# Patient Record
Sex: Female | Born: 1965 | Race: White | Hispanic: No | Marital: Married | Smoking: Current every day smoker
Health system: Southern US, Community
[De-identification: ages and names within clinical notes are randomized; demographics above are authoritative.]

## PROBLEM LIST (undated history)

## (undated) DIAGNOSIS — E785 Hyperlipidemia, unspecified: Secondary | ICD-10-CM

## (undated) DIAGNOSIS — Z720 Tobacco use: Secondary | ICD-10-CM

## (undated) DIAGNOSIS — N6019 Diffuse cystic mastopathy of unspecified breast: Secondary | ICD-10-CM

## (undated) DIAGNOSIS — L309 Dermatitis, unspecified: Secondary | ICD-10-CM

## (undated) DIAGNOSIS — R569 Unspecified convulsions: Secondary | ICD-10-CM

## (undated) DIAGNOSIS — T7840XA Allergy, unspecified, initial encounter: Secondary | ICD-10-CM

## (undated) DIAGNOSIS — F419 Anxiety disorder, unspecified: Secondary | ICD-10-CM

## (undated) DIAGNOSIS — J189 Pneumonia, unspecified organism: Secondary | ICD-10-CM

## (undated) HISTORY — DX: Tobacco use: Z72.0

## (undated) HISTORY — PX: TONSILLECTOMY: SUR1361

## (undated) HISTORY — DX: Dermatitis, unspecified: L30.9

## (undated) HISTORY — PX: TUBAL LIGATION: SHX77

## (undated) HISTORY — PX: OTHER SURGICAL HISTORY: SHX169

## (undated) HISTORY — DX: Anxiety disorder, unspecified: F41.9

## (undated) HISTORY — DX: Hyperlipidemia, unspecified: E78.5

## (undated) HISTORY — PX: BREAST CYST EXCISION: SHX579

## (undated) HISTORY — DX: Pneumonia, unspecified organism: J18.9

## (undated) HISTORY — DX: Diffuse cystic mastopathy of unspecified breast: N60.19

## (undated) HISTORY — DX: Unspecified convulsions: R56.9

## (undated) HISTORY — DX: Allergy, unspecified, initial encounter: T78.40XA

---

## 2000-09-05 ENCOUNTER — Other Ambulatory Visit: Admission: RE | Admit: 2000-09-05 | Discharge: 2000-09-05 | Payer: Self-pay | Admitting: Family Medicine

## 2000-09-12 ENCOUNTER — Encounter: Payer: Self-pay | Admitting: Family Medicine

## 2000-09-12 ENCOUNTER — Encounter: Admission: RE | Admit: 2000-09-12 | Discharge: 2000-09-12 | Payer: Self-pay | Admitting: Family Medicine

## 2001-01-20 ENCOUNTER — Other Ambulatory Visit: Admission: RE | Admit: 2001-01-20 | Discharge: 2001-01-20 | Payer: Self-pay | Admitting: Family Medicine

## 2002-10-04 ENCOUNTER — Other Ambulatory Visit: Admission: RE | Admit: 2002-10-04 | Discharge: 2002-10-04 | Payer: Self-pay | Admitting: Family Medicine

## 2004-04-17 ENCOUNTER — Other Ambulatory Visit: Admission: RE | Admit: 2004-04-17 | Discharge: 2004-04-17 | Payer: Self-pay | Admitting: Family Medicine

## 2005-08-01 ENCOUNTER — Ambulatory Visit: Payer: Self-pay | Admitting: Family Medicine

## 2005-08-01 ENCOUNTER — Other Ambulatory Visit: Admission: RE | Admit: 2005-08-01 | Discharge: 2005-08-01 | Payer: Self-pay | Admitting: Family Medicine

## 2005-08-22 ENCOUNTER — Encounter: Admission: RE | Admit: 2005-08-22 | Discharge: 2005-08-22 | Payer: Self-pay | Admitting: Family Medicine

## 2005-09-17 ENCOUNTER — Ambulatory Visit: Payer: Self-pay | Admitting: Family Medicine

## 2006-05-06 ENCOUNTER — Ambulatory Visit: Payer: Self-pay | Admitting: Family Medicine

## 2006-08-25 ENCOUNTER — Encounter: Admission: RE | Admit: 2006-08-25 | Discharge: 2006-08-25 | Payer: Self-pay | Admitting: Family Medicine

## 2006-10-27 ENCOUNTER — Encounter: Payer: Self-pay | Admitting: Family Medicine

## 2006-10-27 ENCOUNTER — Other Ambulatory Visit: Admission: RE | Admit: 2006-10-27 | Discharge: 2006-10-27 | Payer: Self-pay | Admitting: Family Medicine

## 2006-10-27 ENCOUNTER — Ambulatory Visit: Payer: Self-pay | Admitting: Family Medicine

## 2007-10-09 ENCOUNTER — Telehealth: Payer: Self-pay | Admitting: Family Medicine

## 2007-10-27 DIAGNOSIS — N6019 Diffuse cystic mastopathy of unspecified breast: Secondary | ICD-10-CM | POA: Insufficient documentation

## 2007-10-27 DIAGNOSIS — L259 Unspecified contact dermatitis, unspecified cause: Secondary | ICD-10-CM | POA: Insufficient documentation

## 2007-11-04 ENCOUNTER — Ambulatory Visit: Payer: Self-pay | Admitting: Family Medicine

## 2007-11-04 ENCOUNTER — Encounter: Payer: Self-pay | Admitting: Family Medicine

## 2007-11-04 ENCOUNTER — Other Ambulatory Visit: Admission: RE | Admit: 2007-11-04 | Discharge: 2007-11-04 | Payer: Self-pay | Admitting: Family Medicine

## 2007-11-04 DIAGNOSIS — F17200 Nicotine dependence, unspecified, uncomplicated: Secondary | ICD-10-CM | POA: Insufficient documentation

## 2007-11-10 ENCOUNTER — Encounter (INDEPENDENT_AMBULATORY_CARE_PROVIDER_SITE_OTHER): Payer: Self-pay | Admitting: *Deleted

## 2007-11-10 LAB — CONVERTED CEMR LAB
ALT: 13 units/L (ref 0–35)
AST: 15 units/L (ref 0–37)
Alkaline Phosphatase: 65 units/L (ref 39–117)
BUN: 11 mg/dL (ref 6–23)
Basophils Relative: 0.5 % (ref 0.0–1.0)
Bilirubin, Direct: 0.1 mg/dL (ref 0.0–0.3)
CO2: 30 meq/L (ref 19–32)
Calcium: 9 mg/dL (ref 8.4–10.5)
Chloride: 102 meq/L (ref 96–112)
Cholesterol: 227 mg/dL (ref 0–200)
Creatinine, Ser: 0.8 mg/dL (ref 0.4–1.2)
Eosinophils Relative: 4 % (ref 0.0–5.0)
GFR calc Af Amer: 101 mL/min
Glucose, Bld: 75 mg/dL (ref 70–99)
HDL: 46.7 mg/dL (ref >39.0)
Lymphocytes Relative: 24 % (ref 12.0–46.0)
Monocytes Relative: 6.9 % (ref 3.0–11.0)
Neutro Abs: 4 10*3/uL (ref 1.4–7.7)
Pap Smear: NORMAL
Platelets: 228 10*3/uL (ref 150–400)
RBC: 4.22 M/uL (ref 3.87–5.11)
RDW: 11.6 % (ref 11.5–14.6)
Total Bilirubin: 0.8 mg/dL (ref 0.3–1.2)
Total CHOL/HDL Ratio: 4.9
Total Protein: 6.7 g/dL (ref 6.0–8.3)
VLDL: 53 mg/dL — ABNORMAL HIGH (ref 0–40)
WBC: 6 10*3/uL (ref 4.5–10.5)

## 2008-03-02 ENCOUNTER — Encounter: Admission: RE | Admit: 2008-03-02 | Discharge: 2008-03-02 | Payer: Self-pay | Admitting: Family Medicine

## 2008-03-04 ENCOUNTER — Encounter (INDEPENDENT_AMBULATORY_CARE_PROVIDER_SITE_OTHER): Payer: Self-pay | Admitting: *Deleted

## 2008-03-08 ENCOUNTER — Telehealth: Payer: Self-pay | Admitting: Family Medicine

## 2008-08-10 ENCOUNTER — Ambulatory Visit: Payer: Self-pay | Admitting: Family Medicine

## 2008-11-14 ENCOUNTER — Telehealth: Payer: Self-pay | Admitting: Family Medicine

## 2008-12-08 ENCOUNTER — Encounter: Payer: Self-pay | Admitting: Family Medicine

## 2008-12-08 ENCOUNTER — Ambulatory Visit: Payer: Self-pay | Admitting: Family Medicine

## 2008-12-08 ENCOUNTER — Other Ambulatory Visit: Admission: RE | Admit: 2008-12-08 | Discharge: 2008-12-08 | Payer: Self-pay | Admitting: Family Medicine

## 2008-12-08 DIAGNOSIS — E785 Hyperlipidemia, unspecified: Secondary | ICD-10-CM | POA: Insufficient documentation

## 2008-12-12 ENCOUNTER — Encounter (INDEPENDENT_AMBULATORY_CARE_PROVIDER_SITE_OTHER): Payer: Self-pay | Admitting: *Deleted

## 2008-12-13 ENCOUNTER — Encounter (INDEPENDENT_AMBULATORY_CARE_PROVIDER_SITE_OTHER): Payer: Self-pay | Admitting: *Deleted

## 2008-12-13 LAB — CONVERTED CEMR LAB
ALT: 14 units/L (ref 0–35)
AST: 16 units/L (ref 0–37)
Basophils Absolute: 0.1 10*3/uL (ref 0.0–0.1)
Basophils Relative: 0.9 % (ref 0.0–3.0)
Bilirubin, Direct: 0.1 mg/dL (ref 0.0–0.3)
CO2: 28 meq/L (ref 19–32)
Chloride: 103 meq/L (ref 96–112)
Glucose, Bld: 86 mg/dL (ref 70–99)
HDL: 40.2 mg/dL (ref >39.0)
Hemoglobin: 14.6 g/dL (ref 12.0–15.0)
Lymphocytes Relative: 22.7 % (ref 12.0–46.0)
MCHC: 34.6 g/dL (ref 30.0–36.0)
Monocytes Relative: 4.8 % (ref 3.0–12.0)
Neutrophils Relative %: 69.4 % (ref 43.0–77.0)
RBC: 4.42 M/uL (ref 3.87–5.11)
Sodium: 138 meq/L (ref 135–145)
Total Bilirubin: 0.9 mg/dL (ref 0.3–1.2)
Total CHOL/HDL Ratio: 5.5
Total Protein: 7.3 g/dL (ref 6.0–8.3)
VLDL: 33 mg/dL (ref 0–40)

## 2009-03-06 ENCOUNTER — Encounter: Admission: RE | Admit: 2009-03-06 | Discharge: 2009-03-06 | Payer: Self-pay | Admitting: Family Medicine

## 2009-03-09 ENCOUNTER — Encounter (INDEPENDENT_AMBULATORY_CARE_PROVIDER_SITE_OTHER): Payer: Self-pay | Admitting: *Deleted

## 2009-07-11 ENCOUNTER — Telehealth: Payer: Self-pay | Admitting: Family Medicine

## 2010-02-28 ENCOUNTER — Ambulatory Visit: Payer: Self-pay | Admitting: Family Medicine

## 2010-02-28 DIAGNOSIS — N9089 Other specified noninflammatory disorders of vulva and perineum: Secondary | ICD-10-CM | POA: Insufficient documentation

## 2010-02-28 DIAGNOSIS — M25569 Pain in unspecified knee: Secondary | ICD-10-CM | POA: Insufficient documentation

## 2010-03-05 LAB — CONVERTED CEMR LAB
ALT: 11 units/L (ref 0–35)
AST: 17 units/L (ref 0–37)
Basophils Relative: 0.9 % (ref 0.0–3.0)
Eosinophils Relative: 1.3 % (ref 0.0–5.0)
GFR calc non Af Amer: 82.52 mL/min (ref >60)
HCT: 35.8 % — ABNORMAL LOW (ref 36.0–46.0)
HDL: 51.5 mg/dL (ref >39.00)
Hemoglobin: 12.7 g/dL (ref 12.0–15.0)
Lymphs Abs: 1.2 10*3/uL (ref 0.7–4.0)
Monocytes Relative: 5.8 % (ref 3.0–12.0)
Neutro Abs: 4.5 10*3/uL (ref 1.4–7.7)
Platelets: 194 10*3/uL (ref 150.0–400.0)
Potassium: 4.1 meq/L (ref 3.5–5.1)
RBC: 3.85 M/uL — ABNORMAL LOW (ref 3.87–5.11)
Sodium: 141 meq/L (ref 135–145)
TSH: 1.25 microintl units/mL (ref 0.35–5.50)
Total Bilirubin: 0.4 mg/dL (ref 0.3–1.2)
Total CHOL/HDL Ratio: 4
Total Protein: 7.2 g/dL (ref 6.0–8.3)
VLDL: 31 mg/dL (ref 0.0–40.0)
WBC: 6.2 10*3/uL (ref 4.5–10.5)

## 2010-03-14 ENCOUNTER — Encounter: Admission: RE | Admit: 2010-03-14 | Discharge: 2010-03-14 | Payer: Self-pay | Admitting: Family Medicine

## 2010-03-16 ENCOUNTER — Encounter (INDEPENDENT_AMBULATORY_CARE_PROVIDER_SITE_OTHER): Payer: Self-pay | Admitting: *Deleted

## 2010-03-28 ENCOUNTER — Other Ambulatory Visit: Admission: RE | Admit: 2010-03-28 | Discharge: 2010-03-28 | Payer: Self-pay | Admitting: Family Medicine

## 2010-03-28 ENCOUNTER — Ambulatory Visit: Payer: Self-pay | Admitting: Family Medicine

## 2010-03-28 DIAGNOSIS — W57XXXA Bitten or stung by nonvenomous insect and other nonvenomous arthropods, initial encounter: Secondary | ICD-10-CM

## 2010-03-28 DIAGNOSIS — S60469A Insect bite (nonvenomous) of unspecified finger, initial encounter: Secondary | ICD-10-CM

## 2010-03-28 DIAGNOSIS — L089 Local infection of the skin and subcutaneous tissue, unspecified: Secondary | ICD-10-CM | POA: Insufficient documentation

## 2010-04-06 ENCOUNTER — Encounter: Payer: Self-pay | Admitting: Family Medicine

## 2010-12-09 ENCOUNTER — Encounter: Payer: Self-pay | Admitting: Family Medicine

## 2010-12-20 NOTE — Assessment & Plan Note (Signed)
Summary: CPX/CLE   Vital Signs:  Patient profile:   45 year old female Height:      65.75 inches Weight:      160 pounds BMI:     26.12 Temp:     98.9 degrees F oral Pulse rate:   80 / minute Pulse rhythm:   regular BP sitting:   100 / 60  (left arm) Cuff size:   regular  Vitals Entered By: Lewanda Rife LPN (February 28, 2010 2:18 PM) CC: complete physical with pap smear and breast exam LMP 02/23/10 Pt also has knot on lt side of opening of vagina   History of Present Illness: here for health mt and gyn care - also to review chronic med problems   has a knot on L labia -- it is painful / esp with sitting all day  is trying to work from home  has been running low grade fever   otherwise is doing well   wt is down 8 lb  (overall per pt 15lb)  is eating well and exercising  bp is 100/60  tab -- totally quit - proud of that   BTL in past - no need for birth conrol  pap nl 1/10 periods are heavy but sometmes short - lasting 3-4 days   mam nl 4/10- is due for - wants to schedule  self exam- nothing new   TD 03  lipids last high with LDL 148 has cut out some of the fatty foods and is eating less in general  hopes this will be improved  does like eggs   R knee is really bothering her to put pressure on it  feels like something is "slipping' in it -- no swelling or injury    Allergies: 1)  ! Neosporin 2)  ! Adhesive Tape 3)  Penicillin  Past History:  Past Surgical History: Last updated: 10/27/2007 Pneumonia Right breast cyst Tubal ligation Tonsillectomy  Family History: Last updated: 12/08/2008 Father: HTN, asthma as a child, Mother: DM, HTN, disk surgery, ovarian cysts Siblings: 2 sisters- 1 with migraines and IBS GMM breast ca PGF lung ca sister cervical dysplasia GM has alz at 31 cousin died of melanoma   Social History: Last updated: 12/08/2008 Marital Status: Married Children: 2 girls Occupation: [employer] started smoking again- less than a  ppd  Risk Factors: Smoking Status: quit (11/04/2007)  Past Medical History: fibrocystic breasts  tab abuse - quit 2011 eczema  family hx of melanoma   Derm-- [facility]   Review of Systems General:  Denies fatigue, fever, and malaise. Eyes:  Denies blurring and eye pain. CV:  Denies chest pain or discomfort, lightheadness, and palpitations. Resp:  Denies cough and wheezing. GI:  Denies abdominal pain, bloody stools, change in bowel habits, indigestion, and nausea. GU:  Denies abnormal vaginal bleeding, discharge, and hematuria. MS:  Complains of joint pain and stiffness; denies joint redness, joint swelling, muscle aches, and muscle weakness. Derm:  Denies itching, lesion(s), poor wound healing, and rash. Neuro:  Denies numbness and tingling. Psych:  Denies anxiety and depression. Endo:  Denies excessive thirst and heat intolerance. Heme:  Denies abnormal bruising and bleeding.  Physical Exam  General:  Well-developed,well-nourished,in no acute distress; alert,appropriate and cooperative throughout examination Head:  normocephalic, atraumatic, and no abnormalities observed.   Eyes:  vision grossly intact, pupils equal, pupils round, and pupils reactive to light.  no conjunctival pallor, injection or icterus  Ears:  R ear normal and L ear normal.  Nose:  no nasal discharge.   Mouth:  pharynx pink and moist.   Neck:  supple with full rom and no masses or thyromegally, no JVD or carotid bruit  Chest Wall:  No deformities, masses, or tenderness noted. Breasts:  No mass, nodules, thickening, tenderness, bulging, retraction, inflamation, nipple discharge or skin changes noted.  generally dense breasts  Lungs:  Normal respiratory effort, chest expands symmetrically. Lungs are clear to auscultation, no crackles or wheezes. Heart:  Normal rate and regular rhythm. S1 and S2 normal without gallop, murmur, click, rub or other extra sounds. Abdomen:  Bowel sounds positive,abdomen soft and  non-tender without masses, organomegaly or hernias noted. no renal bruits  Genitalia:  cyst L post labia minora 1 cm/ mobile/ tender/ red and non draining  no ext felt inside vagina Msk:  No deformity or scoliosis noted of thoracic or lumbar spine.  R knee- some patellar tendon and lateral joint line tenderness pain on full flex no eff or swelling   Pulses:  R and L carotid,radial,femoral,dorsalis pedis and posterior tibial pulses are full and equal bilaterally Extremities:  No clubbing, cyanosis, edema, or deformity noted with normal full range of motion of all joints.   Neurologic:  sensation intact to light touch, gait normal, and DTRs symmetrical and normal.   Skin:  Intact without suspicious lesions or rashes Cervical Nodes:  No lymphadenopathy noted Axillary Nodes:  No palpable lymphadenopathy Inguinal Nodes:  No significant adenopathy Psych:  normal affect, talkative and pleasant    Impression & Recommendations:  Problem # 1:  HEALTH MAINTENANCE EXAM (ICD-V70.0) Assessment Comment Only reviewed health habits including diet, exercise and skin cancer prevention reviewed health maintenance list and family history commended on smoking cessation and also wt loss lab toda Orders: Venipuncture (04540) TLB-Lipid Panel (80061-LIPID) TLB-BMP (Basic Metabolic Panel-BMET) (80048-METABOL) TLB-CBC Platelet - w/Differential (85025-CBCD) TLB-Hepatic/Liver Function Pnl (80076-HEPATIC) TLB-TSH (Thyroid Stimulating Hormone) (84443-TSH)  Problem # 2:  CYST, VULVA (ICD-624.8) Assessment: New L side labia minora- posterior/ mobile/ tender/ 1 cm and not draining (very painful) soft in texture  will tx with abx - levaquin (pt is pcn all )  warm compresses or sitz bath off work  donut seat for comfort if worse/fever- call asap or seek care if not imp by friday -- will ref to gyn will come back for pap at another time when this is imp   Problem # 3:  HYPERLIPIDEMIA (ICD-272.4) Assessment:  Unchanged  disc goals for hdl and LDL to reduce cv risks  check labs - exp imp with better diet will rev at f/u Orders: Venipuncture (98119) TLB-Lipid Panel (80061-LIPID) TLB-BMP (Basic Metabolic Panel-BMET) (80048-METABOL) TLB-CBC Platelet - w/Differential (85025-CBCD) TLB-Hepatic/Liver Function Pnl (80076-HEPATIC) TLB-TSH (Thyroid Stimulating Hormone) (84443-TSH)  Labs Reviewed: SGOT: 16 (12/08/2008)   SGPT: 14 (12/08/2008)   HDL:40.2 (12/08/2008), 46.7 (11/04/2007)  LDL:DEL (12/08/2008), DEL (11/04/2007)  Chol:220 (12/08/2008), 227 (11/04/2007)  Trig:167 (12/08/2008), 266 (11/04/2007)  Problem # 4:  KNEE PAIN (JYN-829.56) Assessment: New new/ positional without trauma or swelling ref to sports med /Dr Copland  Her updated medication list for this problem includes:    Vicodin 5-500 Mg Tabs (Hydrocodone-acetaminophen) .Marland Kitchen... 1 by mouth up to every 4-6 hours as needed for severe pain  Complete Medication List: 1)  Zoloft 50 Mg Tabs (Sertraline hcl) .Marland Kitchen.. 1 by mouth once daily 2)  Advil Pm 200-25 Mg Caps (Ibuprofen-diphenhydramine hcl) .... One by mouth at bedtime as needed 3)  Levaquin 500 Mg Tabs (Levofloxacin) .Marland Kitchen.. 1 by mouth once  daily for 10 days 4)  Vicodin 5-500 Mg Tabs (Hydrocodone-acetaminophen) .Marland Kitchen.. 1 by mouth up to every 4-6 hours as needed for severe pain  Other Orders: Radiology Referral (Radiology)  Patient Instructions: 1)  please make appt with Dr Patsy Lager for R knee pain  2)  we will schedule mammogram at check out  3)  keep up the good work with diet and exercise  4)  take the levaquin as directed for vaginal cyst  5)  use warm compress or warm shallow bath for pain and swelling  6)  advil is ok with food  7)  vicodin as needed with caution for sedation  8)  updae me friday - about how pain and cyst are  9)  schedule 15  min visit for pap with me in 6-8 weeks  10)  labs today  Prescriptions: ZOLOFT 50 MG  TABS (SERTRALINE HCL) 1 by mouth once daily  #30 x  11   Entered and Authorized by:   Judith Part MD   Signed by:   Judith Part MD on 02/28/2010   Method used:   Print then Give to Patient   RxID:   [id number] VICODIN 5-500 MG TABS (HYDROCODONE-ACETAMINOPHEN) 1 by mouth up to every 4-6 hours as needed for severe pain  #20 x 0   Entered and Authorized by:   Judith Part MD   Signed by:   Judith Part MD on 02/28/2010   Method used:   Print then Give to Patient   RxID:   [id number] LEVAQUIN 500 MG TABS (LEVOFLOXACIN) 1 by mouth once daily for 10 days  #10 x 0   Entered and Authorized by:   Judith Part MD   Signed by:   Judith Part MD on 02/28/2010   Method used:   Print then Give to Patient   RxID:   [id number]   Current Allergies (reviewed today): ! NEOSPORIN ! ADHESIVE TAPE PENICILLIN

## 2010-12-20 NOTE — Letter (Signed)
Summary: Results Follow up Letter  [place] [facility]  [address] [place], [place] [address]   Phone: [phone]  Fax: [phone]    03/16/2010 MRN: [id number]    Claudia Carter [address] [place], [place]  [address]    Dear Ms. Derocher,  The following are the results of your recent test(s):  Test         Result    Pap Smear:        Normal _____  Not Normal _____ Comments: ______________________________________________________ Cholesterol: LDL(Bad cholesterol):         Your goal is less than:         HDL (Good cholesterol):       Your goal is more than: Comments:  ______________________________________________________ Mammogram:        Normal __X___  Not Normal _____ Comments:  Yearly follow up is recommended.   ___________________________________________________________________ Hemoccult:        Normal _____  Not normal _______ Comments:    _____________________________________________________________________ Other Tests:    We routinely do not discuss normal results over the telephone.  If you desire a copy of the results, or you have any questions about this information we can discuss them at your next office visit.   Sincerely,    Marne A. Milinda Antis, M.D.  MAT:lsf

## 2010-12-20 NOTE — Miscellaneous (Signed)
Summary: pap screening  Clinical Lists Changes  Observations: Added new observation of PAP SMEAR: normal (03/28/2010 15:40)      Preventive Care Screening  Pap Smear:    Date:  03/28/2010    Results:  normal

## 2010-12-20 NOTE — Assessment & Plan Note (Signed)
Summary: pap only/complete cpx from 2 wks ago/dlo   Vital Signs:  Patient profile:   45 year old female Height:      65.75 inches Weight:      160 pounds BMI:     26.12 Temp:     98 degrees F oral Pulse rate:   68 / minute Pulse rhythm:   regular BP sitting:   100 / 64  (left arm) Cuff size:   regular  Vitals Entered By: Lewanda Rife LPN (Mar 28, 2010 4:05 PM) CC: pap only had CPX 2 Weeks ago. LMP 03/22/10 Pt also has insect bite on neck 03/25/10   History of Present Illness: here for gyn exam -- pap smear - which could not be done at [facility]  exam due to a cyst / and then menses  cyst went down to nothing without draining  needs pap today  periods are about the same  regular but pretty heavy  last 4-5 days   already checked breasts last visit   insect bite-- ran into spider web on sunday  did not feel anything  now has a bite that is bigger and red --  is right on her neck   chol was imp- LDL went down into the 130s   Allergies: 1)  ! Neosporin 2)  ! Adhesive Tape 3)  Penicillin  Past History:  Past Medical History: Last updated: 02/28/2010 fibrocystic breasts  tab abuse - quit 2011 eczema  family hx of melanoma   Derm-- Mconnell   Past Surgical History: Last updated: 10/27/2007 Pneumonia Right breast cyst Tubal ligation Tonsillectomy  Family History: Last updated: 12/08/2008 Father: HTN, asthma as a child, Mother: DM, HTN, disk surgery, ovarian cysts Siblings: 2 sisters- 1 with migraines and IBS GMM breast ca PGF lung ca sister cervical dysplasia GM has alz at 51 cousin died of melanoma   Social History: Last updated: 12/08/2008 Marital Status: Married Children: 2 girls Occupation: [employer] started smoking again- less than a ppd  Risk Factors: Smoking Status: quit (11/04/2007)  Review of Systems General:  Denies chills, fatigue, fever, loss of appetite, and malaise. Eyes:  Denies blurring and eye pain. CV:  Denies chest pain or discomfort,  palpitations, shortness of breath with exertion, and swelling of feet. Resp:  Denies cough, shortness of breath, and wheezing. GI:  Denies abdominal pain, bloody stools, change in bowel habits, indigestion, and nausea. GU:  Denies abnormal vaginal bleeding, discharge, dysuria, hematuria, and urinary frequency. Derm:  Complains of insect bite(s), itching, and lesion(s). Neuro:  Denies headaches, numbness, and tingling. Heme:  Denies abnormal bruising, bleeding, and enlarge lymph nodes.  Physical Exam  General:  Well-developed,well-nourished,in no acute distress; alert,appropriate and cooperative throughout examination Head:  normocephalic, atraumatic, and no abnormalities observed.   Eyes:  vision grossly intact, pupils equal, pupils round, and pupils reactive to light.  no conjunctival pallor, injection or icterus no injection.   Mouth:  pharynx pink and moist.   Neck:  see skin exam  no LN  supple with full rom  Chest Wall:  No deformities, masses, or tenderness noted. Breasts:  done at last exam  Lungs:  Normal respiratory effort, chest expands symmetrically. Lungs are clear to auscultation, no crackles or wheezes. Heart:  Normal rate and regular rhythm. S1 and S2 normal without gallop, murmur, click, rub or other extra sounds. Abdomen:  no suprapubic tenderness or fullness felt  Genitalia:  Normal introitus for age, no external lesions, no vaginal discharge, mucosa pink and moist,  no vaginal or cervical lesions, no vaginal atrophy, no friaility or hemorrhage, normal uterus size and position, no adnexal masses or tenderness previously seen labial cyst is resolved  Msk:  no acute joint changes Extremities:  No clubbing, cyanosis, edema, or deformity noted with normal full range of motion of all joints.   Skin:  2-3 cm area of erythema and induration mid ant neck with small scab at center mildly tender few excoriations from itch  no drainage/ firm to palp  no LN   Cervical Nodes:  No  lymphadenopathy noted Inguinal Nodes:  No significant adenopathy Psych:  normal affect, talkative and pleasant    Impression & Recommendations:  Problem # 1:  ROUTINE GYNECOLOGICAL EXAMINATION (ICD-V72.31) Assessment Comment Only  annual exam and pap with no problems  cyst is resolved  no anemia from menses-is tolerating them   Orders: Prescription Created Electronically [phone])  Problem # 2:  CYST, VULVA (ICD-624.8) Assessment: Improved  resolved after warm compresses and levaquin  will continue to monitor   Orders: Prescription Created Electronically [phone])  Problem # 3:  FCE NECK&SCLP NO EYE INSECT BITE NONVENOM INF (ICD-910.5) Assessment: New  insect bite that appears to have infection/ all rxn or both - on ant neck will cover with levaquin (pt pcn all andno hx of exp to mrsa) , and also elicon cream for itch  can lightly cover when working  rev red flags to call - inc size/ pain/ fever  unfortunately , no material to cx -- but if she develops pus or abcess was inst to update me asap  Orders: Prescription Created Electronically ([phone])  Complete Medication List: 1)  Zoloft 50 Mg Tabs (Sertraline hcl) .Marland Kitchen.. 1 by mouth once daily 2)  Advil Pm 200-25 Mg Caps (Ibuprofen-diphenhydramine hcl) .... One by mouth at bedtime as needed 3)  Vicodin 5-500 Mg Tabs (Hydrocodone-acetaminophen) .Marland Kitchen.. 1 by mouth up to every 4-6 hours as needed for severe pain 4)  Levaquin 500 Mg Tabs (Levofloxacin) .Marland Kitchen.. 1 by mouth once daily for 7 days 5)  Elocon 0.1 % Crea (Mometasone furoate) .... Apply to affected area once daily as needed  Patient Instructions: 1)  keep spot on neck clean- antibacterial soap and water  2)  alert me asap if bigger or more painful or any fever  3)  take levaquin as directed 4)  use elicon cream daily  5)  update me in several days if not improved  Prescriptions: ELOCON 0.1 % CREA (MOMETASONE FUROATE) apply to affected area once daily as needed  #1 small x 0    Entered and Authorized by:   Judith Part MD   Signed by:   Judith Part MD on 03/28/2010   Method used:   Electronically to        [facility]* (retail)       [address]       [place], [place]  [address]       Ph: [phone]       Fax: [phone]   RxID:   [id number] LEVAQUIN 500 MG TABS (LEVOFLOXACIN) 1 by mouth once daily for 7 days  #7 x 0   Entered and Authorized by:   Judith Part MD   Signed by:   Judith Part MD on 03/28/2010   Method used:   Electronically to        [facility]* (retail)       [address]       [place], [place]  [address]       Ph: [phone]       Fax: [phone]   RxID:   [id number]   Current Allergies (reviewed today): ! NEOSPORIN ! ADHESIVE TAPE PENICILLIN

## 2010-12-20 NOTE — Letter (Signed)
Summary: Results Follow up Letter  [place] [facility]  [address] [place], [place] [address]   Phone: [phone]  Fax: [phone]    04/06/2010 MRN: [id number]    Claudia Carter [address] [place], [place]  [address]    Dear [name],  The following are the results of your recent test(s):  Test         Result    Pap Smear:        Normal _X____  Not Normal _____ Comments: ______________________________________________________ Cholesterol: LDL(Bad cholesterol):         Your goal is less than:         HDL (Good cholesterol):       Your goal is more than: Comments:  ______________________________________________________ Mammogram:        Normal _____  Not Normal _____ Comments:  ___________________________________________________________________ Hemoccult:        Normal _____  Not normal _______ Comments:    _____________________________________________________________________ Other Tests:    We routinely do not discuss normal results over the telephone.  If you desire a copy of the results, or you have any questions about this information we can discuss them at your next office visit.   Sincerely,    Idamae Schuller Jermall Isaacson,MD  MT/ri

## 2010-12-20 NOTE — Assessment & Plan Note (Signed)
Summary: PAP SMEAR AND CPX/CLE   Vital Signs:  Patient Profile:   45 Years Old Female Height:     65 inches Weight:      168 pounds BMI:     28.06 Temp:     97.7 degrees F oral Pulse rate:   72 / minute Pulse rhythm:   regular BP sitting:   134 / 80  (left arm) Cuff size:   regular  Vitals Entered By: Liane Comber (December 08, 2008 2:20 PM)                 Chief Complaint:  cpx.  History of Present Illness: has been ok- but had a bad year  she is hanging in there- occasionally overwhelmed  does not think she need to see counselor great support in family and friends and husband   is trying to loose wt after the holiday is eating better  is hard to exercise due to weather and short days  in past lost wt on wt watchers   stress - cousin died of melanoma - was there when she passed stress with teenage daughter - lot of worry about dating    last pap 12/08 nl - no problems  periods continue to be heavy/ bloating and pain  cannot take pill because she smokes a little   mam 4/09- wants to set it up   Td 11/03 utd pneumovax 08 did not get flu shot  is interested [facility]   does wear her sunscreen - gets derm checks every 6 mo   cholesterol was mildly high last year  does try to eat healthy most of the time LDL 130s       Current Allergies (reviewed today): ! NEOSPORIN ! ADHESIVE TAPE PENICILLIN  Past Medical History:    fibrocystic breasts     tab abuse -     eczema     family hx of melanoma            Derm-- [facility]   Past Surgical History:    Reviewed history from 10/27/2007 and no changes required:       Pneumonia       Right breast cyst       Tubal ligation       Tonsillectomy   Family History:    Reviewed history from 11/04/2007 and no changes required:       Father: HTN, asthma as a child,       Mother: DM, HTN, disk surgery, ovarian cysts       Siblings: 2 sisters- 1 with migraines and IBS       GMM breast ca       PGF lung ca    sister cervical dysplasia       GM has alz at 67       cousin died of melanoma   Social History:    Reviewed history from 11/04/2007 and no changes required:       Marital Status: Married       Children: 2 girls       Occupation: [employer]       started smoking again- less than a ppd    Review of Systems  General      Complains of fatigue.      Denies fever, loss of appetite, malaise, and weight loss.  Eyes      Denies blurring and eye pain.  ENT      Denies sore throat.  CV  Denies chest pain or discomfort, palpitations, and swelling of feet.  Resp      Denies cough and wheezing.  GI      Denies abdominal pain, bloody stools, and change in bowel habits.  GU      Denies abnormal vaginal bleeding, discharge, and dysuria.  MS      Denies joint pain, joint redness, and joint swelling.  Derm      Denies itching, lesion(s), and rash.  Neuro      Denies numbness and tingling.  Psych      mood is fair given stress  Endo      Denies excessive thirst and excessive urination.  Heme      Denies abnormal bruising and bleeding.   Physical Exam  General:     Well-developed,well-nourished,in no acute distress; alert,appropriate and cooperative throughout examination Head:     normocephalic, atraumatic, and no abnormalities observed.   Eyes:     vision grossly intact, pupils equal, pupils round, and pupils reactive to light.   Ears:     R ear normal and L ear normal.  - scant cerumen Nose:     no nasal discharge.   Mouth:     pharynx pink and moist.   Neck:     supple with full rom and no masses or thyromegally, no JVD or carotid bruit  Breasts:     No mass, nodules, thickening, tenderness, bulging, retraction, inflamation, nipple discharge or skin changes noted.  - breast tissue is very dense/fibrous  Lungs:     Normal respiratory effort, chest expands symmetrically. Lungs are clear to auscultation, no crackles or wheezes. Heart:     Normal rate and  regular rhythm. S1 and S2 normal without gallop, murmur, click, rub or other extra sounds. Abdomen:     Bowel sounds positive,abdomen soft and non-tender without masses, organomegaly or hernias noted. no renal bruits  Genitalia:     Normal introitus for age, no external lesions, no vaginal discharge, mucosa pink and moist, no vaginal or cervical lesions, no vaginal atrophy, no friaility or hemorrhage, normal uterus size and position, no adnexal masses or tenderness Msk:     No deformity or scoliosis noted of thoracic or lumbar spine.  no acute joint changes Pulses:     R and L carotid,radial,femoral,dorsalis pedis and posterior tibial pulses are full and equal bilaterally Extremities:     No clubbing, cyanosis, edema, or deformity noted with normal full range of motion of all joints.   Neurologic:     sensation intact to light touch, gait normal, and DTRs symmetrical and normal.  no tremor  Skin:     Intact without suspicious lesions or rashes lentigos diffusely  Cervical Nodes:     No lymphadenopathy noted Axillary Nodes:     No palpable lymphadenopathy Inguinal Nodes:     No significant adenopathy Psych:     normal affect, talkative and pleasant     Impression & Recommendations:  Problem # 1:  HEALTH MAINTENANCE EXAM (ICD-V70.0) Assessment: Comment Only reviewed health habits including diet, exercise and skin cancer prevention reviewed health maintenance list and family history wellness labs today Orders: Venipuncture (98119) TLB-Lipid Panel (80061-LIPID) TLB-BMP (Basic Metabolic Panel-BMET) (80048-METABOL) TLB-Hepatic/Liver Function Pnl (80076-HEPATIC) TLB-CBC Platelet - w/Differential (85025-CBCD) TLB-TSH (Thyroid Stimulating Hormone) (84443-TSH)   Problem # 2:  ROUTINE GYNECOLOGICAL EXAMINATION (ICD-V72.31) Assessment: Comment Only annual exam with pap for heavy menses could consider OC if she quits smoking  Problem # 3:  TOBACCO USE (  ICD-305.1) Assessment:  Deteriorated discussed in detail risks of smoking, and possible outcomes including COPD, vascular dz, cancer and also respiratory infections/sinus problems    Problem # 4:  OTHER SCREENING MAMMOGRAM (ICD-V76.12) Assessment: Unchanged annual mam planned - enc self exams  very fibrocystic breasts on exam today Orders: Radiology Referral (Radiology)   Problem # 5:  HYPERLIPIDEMIA (ICD-272.4) Assessment: Unchanged disc goal of LDL below 100 to avoid CV disease  rev sat fats in diet in detail disc diet/exercise plan  labs today Labs Reviewed: Chol: 227 (11/04/2007)   HDL: 46.7 (11/04/2007)   LDL: 134.4 (11/04/2007)   TG: 266 (11/04/2007) SGOT: 15 (11/04/2007)   SGPT: 13 (11/04/2007)   Complete Medication List: 1)  Zoloft 50 Mg Tabs (Sertraline hcl) .Marland Kitchen.. 1 by mouth once daily 2)  Advil Pm 200-25 Mg Caps (Ibuprofen-diphenhydramine hcl) .... One by mouth at bedtime as needed  Other Orders: H1N1 vaccine G code (W4132) Influenza A (H1N1) adm  fee Medicare/Non Medicare [phone])   Patient Instructions: 1)  we will set up your mammogram at check out - april 2)  work on healthy diet and exercise 3)  keep working on quitting smoking  4)  update me if you are interested in seeing a counselor  5)  H1N1 vaccine  6)  you can raise your HDL (good cholesterol) by increasing exercise and eating omega 3 fatty acid supplement like fish oil or flax seed oil over the counter 7)  you can lower LDL (bad cholesterol) by limiting saturated fats in diet like red meat, fried foods, egg yolks, fatty breakfast meats, high fat dairy products and shellfish    Prescriptions: ZOLOFT 50 MG  TABS (SERTRALINE HCL) 1 by mouth once daily  #30 x 11   Entered and Authorized by:   Judith Part MD   Signed by:   Judith Part MD on 12/08/2008   Method used:   Print then Give to Patient   RxID:   [id number]     H1N1 # 1    Vaccine Type: H1N1 vaccine G code    Site: right deltoid    Mfr: CSL    Dose:  0.5 ml    Route: IM    Given by: Liane Comber    Exp. Date: 05/17/2009    Lot #: [id number]    VIS given: 08/18/2009 given December 09, 2008.

## 2010-12-20 NOTE — Letter (Signed)
Summary: Out of Work  [facility] [employer] [facility]  [address] [place], [place] [address]   Phone: [phone]  Fax: [phone]    February 28, 2010   Employee:  Helane Rima    To Whom It May Concern:   For Medical reasons, please excuse the above named employee from work for the following dates:  Start:   02/28/2010  End:   can return monday 4/18 if she is feeling better in meantime - can attempt to work at home  If you need additional information, please feel free to contact our office.         Sincerely,    Judith Part MD

## 2011-03-25 ENCOUNTER — Other Ambulatory Visit: Payer: Self-pay | Admitting: *Deleted

## 2011-03-25 MED ORDER — SERTRALINE HCL 50 MG PO TABS: 50.0000 mg | ORAL_TABLET | Freq: Every day | ORAL | Status: DC

## 2011-03-25 NOTE — Telephone Encounter (Signed)
Px written for call in   

## 2011-03-25 NOTE — Telephone Encounter (Signed)
Medication phoned to [facility] as instructed.  

## 2011-10-15 ENCOUNTER — Other Ambulatory Visit: Payer: Self-pay

## 2011-10-15 MED ORDER — SERTRALINE HCL 50 MG PO TABS: 50.0000 mg | ORAL_TABLET | Freq: Every day | ORAL | Status: DC

## 2011-10-15 NOTE — Telephone Encounter (Signed)
Will refill electronically  

## 2011-10-15 NOTE — Telephone Encounter (Signed)
[facility] faxed refill request Sertraline 50 mg. Last refill 09/11/11. Pt already scheduled CPX 12/06/11.

## 2011-11-26 ENCOUNTER — Telehealth: Payer: Self-pay | Admitting: Family Medicine

## 2011-11-26 DIAGNOSIS — Z0000 Encounter for general adult medical examination without abnormal findings: Secondary | ICD-10-CM

## 2011-11-26 DIAGNOSIS — E785 Hyperlipidemia, unspecified: Secondary | ICD-10-CM

## 2011-11-26 NOTE — Telephone Encounter (Signed)
Message copied by Judy Pimple on Tue Nov 26, 2011 10:31 PM ------      Message from: Baldomero Lamy      Created: Thu Nov 21, 2011 10:15 AM      Regarding: Cpx labs Thurs 11/28/11       Please order  future cpx labs for pt's upcomming lab appt.      Thanks      Rodney Booze

## 2011-11-28 ENCOUNTER — Other Ambulatory Visit (INDEPENDENT_AMBULATORY_CARE_PROVIDER_SITE_OTHER): Payer: BC Managed Care – PPO

## 2011-11-28 DIAGNOSIS — Z0000 Encounter for general adult medical examination without abnormal findings: Secondary | ICD-10-CM

## 2011-11-28 DIAGNOSIS — E785 Hyperlipidemia, unspecified: Secondary | ICD-10-CM

## 2011-11-28 LAB — CBC WITH DIFFERENTIAL/PLATELET
Basophils Relative: 0.7 % (ref 0.0–3.0)
Eosinophils Relative: 2.7 % (ref 0.0–5.0)
HCT: 39.8 % (ref 36.0–46.0)
Lymphs Abs: 1.6 10*3/uL (ref 0.7–4.0)
MCV: 96.4 fl (ref 78.0–100.0)
Monocytes Absolute: 0.4 10*3/uL (ref 0.1–1.0)
Platelets: 212 10*3/uL (ref 150.0–400.0)
WBC: 4.4 10*3/uL — ABNORMAL LOW (ref 4.5–10.5)

## 2011-11-28 LAB — LIPID PANEL
Cholesterol: 291 mg/dL — ABNORMAL HIGH (ref 0–200)
Total CHOL/HDL Ratio: 5
Triglycerides: 277 mg/dL — ABNORMAL HIGH (ref 0.0–149.0)
VLDL: 55.4 mg/dL — ABNORMAL HIGH (ref 0.0–40.0)

## 2011-11-28 LAB — COMPREHENSIVE METABOLIC PANEL
Alkaline Phosphatase: 61 U/L (ref 39–117)
BUN: 17 mg/dL (ref 6–23)
Glucose, Bld: 93 mg/dL (ref 70–99)
Sodium: 138 mEq/L (ref 135–145)
Total Bilirubin: 0.7 mg/dL (ref 0.3–1.2)

## 2011-11-28 NOTE — Progress Notes (Signed)
Addended by: Baldomero Lamy on: 11/28/2011 09:21 AM   Modules accepted: Orders

## 2011-12-05 ENCOUNTER — Encounter: Payer: Self-pay | Admitting: Family Medicine

## 2011-12-06 ENCOUNTER — Other Ambulatory Visit (HOSPITAL_COMMUNITY)
Admission: RE | Admit: 2011-12-06 | Discharge: 2011-12-06 | Disposition: A | Discharge status: Home / Self Care | Payer: BC Managed Care – PPO | Source: Ambulatory Visit | Attending: Family Medicine | Admitting: Family Medicine

## 2011-12-06 ENCOUNTER — Ambulatory Visit (INDEPENDENT_AMBULATORY_CARE_PROVIDER_SITE_OTHER): Payer: BC Managed Care – PPO | Admitting: Family Medicine

## 2011-12-06 ENCOUNTER — Encounter: Payer: Self-pay | Admitting: Family Medicine

## 2011-12-06 VITALS — BP 110/72 | HR 76 | Temp 97.9°F | Ht 65.5 in | Wt 161.2 lb

## 2011-12-06 DIAGNOSIS — Z1231 Encounter for screening mammogram for malignant neoplasm of breast: Secondary | ICD-10-CM | POA: Insufficient documentation

## 2011-12-06 DIAGNOSIS — E785 Hyperlipidemia, unspecified: Secondary | ICD-10-CM

## 2011-12-06 DIAGNOSIS — Z23 Encounter for immunization: Secondary | ICD-10-CM

## 2011-12-06 DIAGNOSIS — Z0000 Encounter for general adult medical examination without abnormal findings: Secondary | ICD-10-CM

## 2011-12-06 DIAGNOSIS — Z01419 Encounter for gynecological examination (general) (routine) without abnormal findings: Secondary | ICD-10-CM | POA: Insufficient documentation

## 2011-12-06 MED ORDER — SERTRALINE HCL 50 MG PO TABS: 50.0000 mg | ORAL_TABLET | Freq: Every day | ORAL | Status: DC

## 2011-12-06 MED ORDER — ATORVASTATIN CALCIUM 10 MG PO TABS: 10.0000 mg | ORAL_TABLET | Freq: Every day | ORAL | Status: DC

## 2011-12-06 NOTE — Assessment & Plan Note (Signed)
Reviewed health habits including diet and exercise and skin cancer prevention Also reviewed health mt list, fam hx and immunizations   Rev wellness lab in detail Tdap and flu shots today

## 2011-12-06 NOTE — Assessment & Plan Note (Signed)
Scheduled annual screening mammogram Nl breast exam today  Encouraged monthly self exams   

## 2011-12-06 NOTE — Assessment & Plan Note (Signed)
Annual exam with pap done No problems unremarkable

## 2011-12-06 NOTE — Assessment & Plan Note (Signed)
Cholesterol up significantly without change in diet Rev diet in detail Does need to exercise Disc goals for lipids and reasons to control them Rev labs with pt Rev low sat fat diet in detail  Suspect this is hereditary Will start on lipitor 10  Disc poss side eff in detail  Fasting lab 6 wk

## 2011-12-06 NOTE — Progress Notes (Signed)
Subjective:    Patient ID: Claudia Carter, female    DOB: 08-03-1966, 46 y.o.   MRN: [id number]  HPI Here for health maintenance exam and to review chronic medical problems   Is doing ok overall  Thought she had gained more weight   Had a place on back of leg- thought it was a cyst- gone now Otherwise doing well   bp good 110/72 Wt is up 1 lb with bmi of 26  Lipids are up Lab Results  Component Value Date   CHOL 291* 11/28/2011   CHOL 203* 02/28/2010   CHOL 220* 12/08/2008   Lab Results  Component Value Date   HDL 55.90 11/28/2011   HDL 40.98 02/28/2010   HDL 11.9 12/08/2008   No results found for this basename: LDLCALC   Lab Results  Component Value Date   TRIG 277.0* 11/28/2011   TRIG 155.0* 02/28/2010   TRIG 167* 12/08/2008   Lab Results  Component Value Date   CHOLHDL 5 11/28/2011   CHOLHDL 4 02/28/2010   CHOLHDL 5.5 CALC 12/08/2008   Lab Results  Component Value Date   LDLDIRECT 173.2 11/28/2011   LDLDIRECT 134.4 02/28/2010   LDLDIRECT 148.7 12/08/2008   diet- has not changed at all , most cholesterol she has is egg twice a week (usually whites), occ has a glass of wine  No red meat/ no fast food / no fried food / eats fat free cheese/ fat free cheese  Exercise- has been slack since November -- with the weather , [facility]  Will [facility] today  High cholesterol runs in the family    Pap 5/11 Needs that  Once had abn pap - nl on re check  No hpv, no new partners   Mam 4/11-- [facility]  Self exam no lumps or changes   Flu vaccine-- will get that today  Tdap--will do that today  Patient Active Problem List  Diagnoses  . HYPERLIPIDEMIA  . FIBROCYSTIC BREAST DISEASE  . CYST, VULVA  . ECZEMA  . KNEE PAIN  . FCE NECK&SCLP NO EYE INSECT BITE NONVENOM INF  . TOBACCO USE, QUIT  . Routine general medical examination at a health care facility  . Other screening mammogram  . Gynecological examination   Past Medical History    Diagnosis Date  . Fibrocystic breast   . Tobacco abuse     quit 2011  . Eczema   . Melanoma    Past Surgical History  Procedure Date  . Pneumonia   . Breast cyst excision   . Tonsillectomy    History  Substance Use Topics  . Smoking status: Former Smoker    Quit date: 11/18/2009  . Smokeless tobacco: Not on file  . Alcohol Use: Not on file   Family History  Problem Relation Age of Onset  . Diabetes Mother   . Hypertension Mother   . Ovarian cysts Mother   . Hypertension Father   . Asthma Father   . Migraines Sister   . Cancer Maternal Grandmother     breast  . Cancer Paternal Grandfather     lung  . Cancer Cousin     melanoma   Allergies  Allergen Reactions  . Penicillins     REACTION: rash  . Triple Antibiotic     REACTION: rash   Current Outpatient Prescriptions on File Prior to Visit  Medication Sig Dispense Refill  . Ibuprofen-Diphenhydramine HCl (ADVIL PM) 200-25  MG CAPS Take 1 capsule by mouth at bedtime as needed.      Marland Kitchen HYDROcodone-acetaminophen (VICODIN) 5-500 MG per tablet Take 1 tablet by mouth every 4 (four) hours as needed.      . mometasone (ELOCON) 0.1 % cream Apply 1 application topically daily as needed.          Review of Systems Review of Systems  Constitutional: Negative for fever, appetite change, fatigue and unexpected weight change.  Eyes: Negative for pain and visual disturbance.  Respiratory: Negative for cough and shortness of breath.   Cardiovascular: Negative for cp or palpitations    Gastrointestinal: Negative for nausea, diarrhea and constipation.  Genitourinary: Negative for urgency and frequency.  Skin: Negative for pallor or rash   Neurological: Negative for weakness, light-headedness, numbness and headaches.  Hematological: Negative for adenopathy. Does not bruise/bleed easily.  Psychiatric/Behavioral: Negative for dysphoric mood. The patient is not nervous/anxious.          Objective:   Physical Exam   Constitutional: She appears well-developed and well-nourished. No distress.  HENT:  Head: Normocephalic and atraumatic.  Right Ear: External ear normal.  Left Ear: External ear normal.  Nose: Nose normal.  Mouth/Throat: Oropharynx is clear and moist.  Eyes: Conjunctivae and EOM are normal. Pupils are equal, round, and reactive to light. No scleral icterus.  Neck: Normal range of motion. Neck supple. No JVD present. Carotid bruit is not present. No thyromegaly present.  Cardiovascular: Normal rate, regular rhythm, normal heart sounds and intact distal pulses.  Exam reveals no gallop.   Pulmonary/Chest: Effort normal and breath sounds normal. No respiratory distress. She has no wheezes.  Abdominal: Soft. Bowel sounds are normal. She exhibits no distension, no abdominal bruit and no mass. There is no tenderness.  Genitourinary: Rectum normal, vagina normal and uterus normal. No breast swelling, tenderness, discharge or bleeding. There is no rash or tenderness on the right labia. There is no rash or tenderness on the left labia. Uterus is not enlarged and not tender. Cervix exhibits no motion tenderness, no discharge and no friability. Right adnexum displays no mass, no tenderness and no fullness. Left adnexum displays no mass, no tenderness and no fullness. No tenderness or bleeding around the vagina. No vaginal discharge found.       Breast exam: No mass, nodules, thickening, tenderness, bulging, retraction, inflamation, nipple discharge or skin changes noted.  No axillary or clavicular LA.  Chaperoned exam.    Musculoskeletal: Normal range of motion. She exhibits no edema and no tenderness.  Lymphadenopathy:    She has no cervical adenopathy.  Neurological: She is alert. She has normal reflexes. No cranial nerve deficit. She exhibits normal muscle tone. Coordination normal.  Skin: Skin is warm and dry. No rash noted. No erythema. No pallor.  Psychiatric: She has a normal mood and affect.           Assessment & Plan:

## 2011-12-06 NOTE — Patient Instructions (Addendum)
We will schedule mammogram at check out Flu shot and Tdap vaccine today Start generic lipitor 10 mg daily  Schedule fasting lab in 6 weeks  If any side effects, stop it and call  Avoid red meat/ fried foods/ egg yolks/ fatty breakfast meats/ butter, cheese and high fat dairy/ and shellfish

## 2011-12-11 ENCOUNTER — Encounter: Payer: Self-pay | Admitting: *Deleted

## 2011-12-16 ENCOUNTER — Ambulatory Visit
Admission: RE | Admit: 2011-12-16 | Discharge: 2011-12-16 | Disposition: A | Discharge status: Home / Self Care | Payer: BC Managed Care – PPO | Source: Ambulatory Visit | Attending: Family Medicine | Admitting: Family Medicine

## 2011-12-16 DIAGNOSIS — Z1231 Encounter for screening mammogram for malignant neoplasm of breast: Secondary | ICD-10-CM

## 2011-12-19 ENCOUNTER — Encounter: Payer: Self-pay | Admitting: *Deleted

## 2012-01-17 ENCOUNTER — Other Ambulatory Visit (INDEPENDENT_AMBULATORY_CARE_PROVIDER_SITE_OTHER): Payer: BC Managed Care – PPO

## 2012-01-17 DIAGNOSIS — E785 Hyperlipidemia, unspecified: Secondary | ICD-10-CM

## 2012-01-17 LAB — LIPID PANEL
HDL: 51.4 mg/dL (ref >39.00)
Total CHOL/HDL Ratio: 3
VLDL: 39.4 mg/dL (ref 0.0–40.0)

## 2012-01-17 LAB — ALT: ALT: 15 U/L (ref 0–35)

## 2012-12-16 ENCOUNTER — Other Ambulatory Visit: Payer: Self-pay | Admitting: Family Medicine

## 2012-12-16 NOTE — Telephone Encounter (Signed)
Please schedule PE when able and refil until then, thanks

## 2012-12-16 NOTE — Telephone Encounter (Signed)
No recent appt and no future appt, ok to refill? 

## 2012-12-17 NOTE — Telephone Encounter (Signed)
Called home phone and no answer and couldn't leave a voicemail so called cell # and left voicemail requesting pt to call office

## 2012-12-18 ENCOUNTER — Other Ambulatory Visit: Payer: Self-pay | Admitting: *Deleted

## 2012-12-18 MED ORDER — ATORVASTATIN CALCIUM 10 MG PO TABS: 10.0000 mg | ORAL_TABLET | Freq: Every day | ORAL | Status: DC

## 2012-12-18 NOTE — Telephone Encounter (Signed)
Pt's husband came in re refill, pharmacy had not heard back on lipitor request. I advised husband I would send it in, but she did need to make an appt for a cpx within the next couple of months.

## 2012-12-28 ENCOUNTER — Other Ambulatory Visit: Payer: Self-pay | Admitting: Family Medicine

## 2012-12-28 NOTE — Telephone Encounter (Signed)
Ok to refill 

## 2012-12-28 NOTE — Telephone Encounter (Signed)
done

## 2012-12-28 NOTE — Telephone Encounter (Signed)
Please refil for 6 mo 

## 2013-05-04 ENCOUNTER — Other Ambulatory Visit: Payer: Self-pay | Admitting: Family Medicine

## 2013-05-04 NOTE — Telephone Encounter (Signed)
Electronic refill request, no recent/future appts., please advise  

## 2013-05-04 NOTE — Telephone Encounter (Signed)
Left voicemail requesting pt to call office 

## 2013-05-04 NOTE — Telephone Encounter (Signed)
Please schedule a follow up and refill until then, thanks  

## 2013-05-12 NOTE — Telephone Encounter (Signed)
Left voicemail requesting pt to call office 

## 2013-05-14 NOTE — Telephone Encounter (Signed)
Left voicemail requesting pt to call office and mailed letter to pt letting her know she is due for a f/u before we can refill med

## 2013-05-20 NOTE — Telephone Encounter (Signed)
Pt never responded to my calls or the letter I mailed so I declined Rx and advised pharmacy f/u appt is needed before refilling med

## 2013-05-24 ENCOUNTER — Other Ambulatory Visit: Payer: Self-pay

## 2013-05-24 NOTE — Telephone Encounter (Signed)
Pt left v/m requesting refill atorvastatin 10 mg to [facility]. Pt scheduled CPX 10/11/13. Pt last seen 12/06/11.Please advise.

## 2013-05-24 NOTE — Telephone Encounter (Signed)
Please schedule labs for lipid/ast/alt fasting  Then give refills until her upcoming appt

## 2013-05-25 MED ORDER — ATORVASTATIN CALCIUM 10 MG PO TABS: 10.0000 mg | ORAL_TABLET | Freq: Every day | ORAL | Status: DC

## 2013-05-25 NOTE — Telephone Encounter (Signed)
Lab appt scheduled and med refilled until her appt, called and advise pt's spouse that Rx sent to pharmacy

## 2013-07-12 ENCOUNTER — Other Ambulatory Visit: Payer: Self-pay | Admitting: Family Medicine

## 2013-07-12 NOTE — Telephone Encounter (Signed)
Electronic refill request, please advise  

## 2013-07-12 NOTE — Telephone Encounter (Signed)
Done

## 2013-07-12 NOTE — Telephone Encounter (Signed)
Please refill enough to get by until her f/u late November-thanks

## 2013-10-06 ENCOUNTER — Other Ambulatory Visit (INDEPENDENT_AMBULATORY_CARE_PROVIDER_SITE_OTHER): Payer: BC Managed Care – PPO

## 2013-10-06 ENCOUNTER — Telehealth: Payer: Self-pay | Admitting: Family Medicine

## 2013-10-06 DIAGNOSIS — E785 Hyperlipidemia, unspecified: Secondary | ICD-10-CM

## 2013-10-06 DIAGNOSIS — Z0000 Encounter for general adult medical examination without abnormal findings: Secondary | ICD-10-CM

## 2013-10-06 LAB — LIPID PANEL
Cholesterol: 190 mg/dL (ref 0–200)
HDL: 57.1 mg/dL (ref >39.00)
VLDL: 50.4 mg/dL — ABNORMAL HIGH (ref 0.0–40.0)

## 2013-10-06 LAB — COMPREHENSIVE METABOLIC PANEL
ALT: 20 U/L (ref 0–35)
Albumin: 4.5 g/dL (ref 3.5–5.2)
BUN: 20 mg/dL (ref 6–23)
CO2: 28 mEq/L (ref 19–32)
Calcium: 9.7 mg/dL (ref 8.4–10.5)
Chloride: 102 mEq/L (ref 96–112)
Creatinine, Ser: 0.8 mg/dL (ref 0.4–1.2)
GFR: 77.85 mL/min (ref >60.00)
Glucose, Bld: 93 mg/dL (ref 70–99)
Potassium: 3.9 mEq/L (ref 3.5–5.1)
Total Bilirubin: 0.8 mg/dL (ref 0.3–1.2)
Total Protein: 7.3 g/dL (ref 6.0–8.3)

## 2013-10-06 LAB — CBC WITH DIFFERENTIAL/PLATELET
Basophils Relative: 0.5 % (ref 0.0–3.0)
Eosinophils Absolute: 0.3 10*3/uL (ref 0.0–0.7)
Eosinophils Relative: 4.6 % (ref 0.0–5.0)
HCT: 42.2 % (ref 36.0–46.0)
Hemoglobin: 14.4 g/dL (ref 12.0–15.0)
Lymphocytes Relative: 32.8 % (ref 12.0–46.0)
Lymphs Abs: 1.8 10*3/uL (ref 0.7–4.0)
Monocytes Relative: 6.8 % (ref 3.0–12.0)
Neutro Abs: 3.1 10*3/uL (ref 1.4–7.7)
RBC: 4.5 Mil/uL (ref 3.87–5.11)
RDW: 12.6 % (ref 11.5–14.6)

## 2013-10-06 LAB — LDL CHOLESTEROL, DIRECT: Direct LDL: 105.5 mg/dL

## 2013-10-06 NOTE — Telephone Encounter (Signed)
Message copied by Judy Pimple on Wed Oct 06, 2013 10:31 AM ------      Message from: Alvina Chou      Created: Wed Sep 29, 2013  2:53 PM      Regarding: Lab orders for Wednesday, 11.19.14       Patient is scheduled for CPX labs, please order future labs, Thanks , Terri       ------

## 2013-10-11 ENCOUNTER — Ambulatory Visit (INDEPENDENT_AMBULATORY_CARE_PROVIDER_SITE_OTHER): Payer: BC Managed Care – PPO | Admitting: Family Medicine

## 2013-10-11 ENCOUNTER — Encounter: Payer: Self-pay | Admitting: Family Medicine

## 2013-10-11 ENCOUNTER — Other Ambulatory Visit (HOSPITAL_COMMUNITY)
Admission: RE | Admit: 2013-10-11 | Discharge: 2013-10-11 | Disposition: A | Discharge status: Home / Self Care | Payer: BC Managed Care – PPO | Source: Ambulatory Visit | Attending: Family Medicine | Admitting: Family Medicine

## 2013-10-11 VITALS — BP 124/80 | HR 84 | Temp 97.5°F | Ht 65.0 in | Wt 176.8 lb

## 2013-10-11 DIAGNOSIS — Z23 Encounter for immunization: Secondary | ICD-10-CM

## 2013-10-11 DIAGNOSIS — Z1151 Encounter for screening for human papillomavirus (HPV): Secondary | ICD-10-CM | POA: Insufficient documentation

## 2013-10-11 DIAGNOSIS — Z01419 Encounter for gynecological examination (general) (routine) without abnormal findings: Secondary | ICD-10-CM | POA: Insufficient documentation

## 2013-10-11 DIAGNOSIS — Z0000 Encounter for general adult medical examination without abnormal findings: Secondary | ICD-10-CM

## 2013-10-11 DIAGNOSIS — E785 Hyperlipidemia, unspecified: Secondary | ICD-10-CM

## 2013-10-11 MED ORDER — ATORVASTATIN CALCIUM 10 MG PO TABS: 10.0000 mg | ORAL_TABLET | Freq: Every day | ORAL | Status: DC

## 2013-10-11 MED ORDER — SERTRALINE HCL 50 MG PO TABS: 50.0000 mg | ORAL_TABLET | Freq: Every day | ORAL | Status: DC

## 2013-10-11 NOTE — Progress Notes (Signed)
Pre-visit discussion using our clinic review tool. No additional management support is needed unless otherwise documented below in the visit note.  

## 2013-10-11 NOTE — Assessment & Plan Note (Signed)
Annual exam with pap  Going though perimenopause-disc expectations

## 2013-10-11 NOTE — Progress Notes (Signed)
Subjective:    Patient ID: Claudia Carter, female    DOB: February 28, 1966, 47 y.o.   MRN: [id number]  HPI Here for health maintenance exam and to review chronic medical problems    Wt is up 15 lb with bmi of 29  (gained and then lost 6 lb)- now is back on track  Is starting to exercise - she gets slack this time of year   Flu vaccine - got today   Td 1/13  Pap 1/13 normal Gyn hx - once had abn pap that was normal on repeat  No gyn problems  Periods are less frequent and having hot flashes at night    Mammogram 1/13 - is due for one - she will schedule one at the [facility]  Self exam  Mood- is good/ stays happy and she stays motivated   occ her R ear rings- comes and goes - poss with allergy congestion   Hx of melanoma - needs to go to derm -will make her own appt (cousin died with melanoma )     Chemistry      Component Value Date/Time   NA 138 10/06/2013 1053   K 3.9 10/06/2013 1053   CL 102 10/06/2013 1053   CO2 28 10/06/2013 1053   BUN 20 10/06/2013 1053   CREATININE 0.8 10/06/2013 1053      Component Value Date/Time   CALCIUM 9.7 10/06/2013 1053   ALKPHOS 69 10/06/2013 1053   AST 18 10/06/2013 1053   ALT 20 10/06/2013 1053   BILITOT 0.8 10/06/2013 1053     Lab Results  Component Value Date   WBC 5.6 10/06/2013   HGB 14.4 10/06/2013   HCT 42.2 10/06/2013   MCV 93.6 10/06/2013   PLT 199.0 10/06/2013   Lab Results  Component Value Date   TSH 2.45 10/06/2013   Lab Results  Component Value Date   CHOL 190 10/06/2013   CHOL 173 01/17/2012   CHOL 291* 11/28/2011   Lab Results  Component Value Date   HDL 57.10 10/06/2013   HDL 51.40 01/17/2012   HDL 55.90 11/28/2011   Lab Results  Component Value Date   LDLCALC 82 01/17/2012   Lab Results  Component Value Date   TRIG 252.0* 10/06/2013   TRIG 197.0* 01/17/2012   TRIG 277.0* 11/28/2011   Lab Results  Component Value Date   CHOLHDL 3 10/06/2013   CHOLHDL 3 01/17/2012   CHOLHDL 5 11/28/2011   Lab Results   Component Value Date   LDLDIRECT 105.5 10/06/2013   LDLDIRECT 173.2 11/28/2011   LDLDIRECT 134.4 02/28/2010    Trig high - ? What she ate before  Overall pretty good profile   Patient Active Problem List   Diagnosis Date Noted  . Other screening mammogram 12/06/2011  . Gynecological examination 12/06/2011  . Routine general medical examination at a health care facility 11/26/2011  . FCE NECK&SCLP NO EYE INSECT BITE NONVENOM INF 03/28/2010  . CYST, VULVA 02/28/2010  . KNEE PAIN 02/28/2010  . HYPERLIPIDEMIA 12/08/2008  . TOBACCO USE, QUIT 11/04/2007  . FIBROCYSTIC BREAST DISEASE 10/27/2007  . ECZEMA 10/27/2007   Past Medical History  Diagnosis Date  . Fibrocystic breast   . Tobacco abuse     quit 2011  . Eczema   . Melanoma    Past Surgical History  Procedure Laterality Date  . Pneumonia    . Breast cyst excision    . Tonsillectomy     History  Substance Use Topics  .  Smoking status: Former Smoker    Quit date: 11/18/2009  . Smokeless tobacco: Not on file  . Alcohol Use: No   Family History  Problem Relation Age of Onset  . Diabetes Mother   . Hypertension Mother   . Ovarian cysts Mother   . Hypertension Father   . Asthma Father   . Migraines Sister   . Cancer Maternal Grandmother     breast  . Cancer Paternal Grandfather     lung  . Cancer Cousin     melanoma   Allergies  Allergen Reactions  . Neomycin-Bacitracin Zn-Polymyx     REACTION: rash  . Penicillins     REACTION: rash   Current Outpatient Prescriptions on File Prior to Visit  Medication Sig Dispense Refill  . atorvastatin (LIPITOR) 10 MG tablet Take 1 tablet (10 mg total) by mouth daily.  30 tablet  4  . Ibuprofen-Diphenhydramine HCl (ADVIL PM) 200-25 MG CAPS Take 1 capsule by mouth at bedtime as needed.      . sertraline (ZOLOFT) 50 MG tablet TAKE ONE (1) TABLET BY MOUTH EVERY DAY  30 tablet  2   No current facility-administered medications on file prior to visit.     Review of  Systems     Objective:   Physical Exam  Constitutional: She appears well-developed and well-nourished. No distress.  overwt and well appearing   HENT:  Head: Normocephalic and atraumatic.  Right Ear: External ear normal.  Left Ear: External ear normal.  Mouth/Throat: Oropharynx is clear and moist.  Eyes: Conjunctivae and EOM are normal. Pupils are equal, round, and reactive to light. No scleral icterus.  Neck: Normal range of motion. Neck supple. No JVD present. Carotid bruit is not present. No thyromegaly present.  Cardiovascular: Normal rate, regular rhythm, normal heart sounds and intact distal pulses.  Exam reveals no gallop.   Pulmonary/Chest: Effort normal and breath sounds normal. No respiratory distress. She has no wheezes. She exhibits no tenderness.  Abdominal: Soft. Bowel sounds are normal. She exhibits no distension, no abdominal bruit and no mass. There is no tenderness.  Genitourinary: Vagina normal and uterus normal. No breast swelling, tenderness, discharge or bleeding. There is no rash, tenderness or lesion on the right labia. There is no rash, tenderness or lesion on the left labia. Uterus is not enlarged and not tender. Cervix exhibits no motion tenderness, no discharge and no friability. Right adnexum displays no mass, no tenderness and no fullness. Left adnexum displays no mass, no tenderness and no fullness. No bleeding around the vagina. No vaginal discharge found.  Breast exam: No mass, nodules, thickening, tenderness, bulging, retraction, inflamation, nipple discharge or skin changes noted.  No axillary or clavicular LA.  Chaperoned exam.    Musculoskeletal: Normal range of motion. She exhibits no edema and no tenderness.  Lymphadenopathy:    She has no cervical adenopathy.  Neurological: She is alert. She has normal reflexes. No cranial nerve deficit. She exhibits normal muscle tone. Coordination normal.  Skin: Skin is warm and dry. No rash noted. No erythema. No  pallor.  lentigos diffusely  Psychiatric: She has a normal mood and affect.          Assessment & Plan:

## 2013-10-11 NOTE — Assessment & Plan Note (Signed)
Disc goals for lipids and reasons to control them Rev labs with pt Rev low sat fat diet in detail   

## 2013-10-11 NOTE — Assessment & Plan Note (Signed)
Reviewed health habits including diet and exercise and skin cancer prevention Reviewed appropriate screening tests for age  Also reviewed health mt list, fam hx and immunization status , as well as social and family history   Rev wellness labs  Flu vaccine today

## 2013-10-11 NOTE — Patient Instructions (Signed)
Take care of yourself  Keep working on weight loss  Don't forget to see dermatology for a skin check  Flu shot today  Labs are ok  Don't forget to schedule your mammogram

## 2013-10-15 ENCOUNTER — Encounter: Payer: Self-pay | Admitting: *Deleted

## 2013-11-29 ENCOUNTER — Other Ambulatory Visit: Payer: Self-pay

## 2013-11-29 DIAGNOSIS — Z1231 Encounter for screening mammogram for malignant neoplasm of breast: Secondary | ICD-10-CM

## 2013-12-10 ENCOUNTER — Ambulatory Visit
Admission: RE | Admit: 2013-12-10 | Discharge: 2013-12-10 | Disposition: A | Discharge status: Home / Self Care | Payer: BC Managed Care – PPO | Source: Ambulatory Visit

## 2013-12-10 DIAGNOSIS — Z1231 Encounter for screening mammogram for malignant neoplasm of breast: Secondary | ICD-10-CM

## 2013-12-10 LAB — HM MAMMOGRAPHY: HM Mammogram: NORMAL

## 2013-12-14 ENCOUNTER — Encounter: Payer: Self-pay | Admitting: Family Medicine

## 2013-12-14 ENCOUNTER — Encounter: Payer: Self-pay | Admitting: *Deleted

## 2014-06-22 ENCOUNTER — Ambulatory Visit (INDEPENDENT_AMBULATORY_CARE_PROVIDER_SITE_OTHER): Payer: BC Managed Care – PPO | Admitting: Family Medicine

## 2014-06-22 ENCOUNTER — Encounter: Payer: Self-pay | Admitting: Family Medicine

## 2014-06-22 VITALS — BP 114/86 | HR 89 | Temp 98.2°F | Ht 65.0 in | Wt 185.8 lb

## 2014-06-22 DIAGNOSIS — H8113 Benign paroxysmal vertigo, bilateral: Secondary | ICD-10-CM

## 2014-06-22 DIAGNOSIS — R42 Dizziness and giddiness: Secondary | ICD-10-CM

## 2014-06-22 DIAGNOSIS — H8110 Benign paroxysmal vertigo, unspecified ear: Secondary | ICD-10-CM

## 2014-06-22 MED ORDER — MECLIZINE HCL 25 MG PO TABS: 25.0000 mg | ORAL_TABLET | Freq: Three times a day (TID) | ORAL | Status: DC | PRN

## 2014-06-22 MED ORDER — FLUTICASONE PROPIONATE 50 MCG/ACT NA SUSP: 2.0000 | Freq: Every day | NASAL | Status: DC

## 2014-06-22 NOTE — Progress Notes (Signed)
Pre visit review using our clinic review tool, if applicable. No additional management support is needed unless otherwise documented below in the visit note. 

## 2014-06-22 NOTE — Assessment & Plan Note (Signed)
Reassuring exam Pt has ear pressure and tinnitus on and off -suspect some ETD Will tx with flonase 2 -4 wk  Meclizine if needed Update if not starting to improve in a week or if worsening  Would ref to ENT if not better in 2-3 wk

## 2014-06-22 NOTE — Patient Instructions (Signed)
I think you have positional vertigo-which is not uncommon  It may be coming from a congested inner ear (though it looks good today) - try the flonase for at least 2 weeks If dizziness worsens or lasts longer - take meclizine (it will sedate)  If not improved in 2-3 weeks -please call for a referral to ENT

## 2014-06-22 NOTE — Progress Notes (Signed)
Subjective:    Patient ID: Claudia Carter, female    DOB: 1966-06-30, 48 y.o.   MRN: [id number]  HPI Here for dizziness   Started on vacation camping  Symptoms started last Tuesday and have been intermittent since  Dizzy when she looks up /turns head and when she turns over in bed  The room spins - but it does not last very long - has to stay very still  Does not get nauseated   No ear pain /no drainage Feels pressure-like there is fluid in it  No fever or congestion   Also tired   Has been swimming quite a bit also    Ordinarily no motion sickness Had terrible ear infections as a child - and some hearing loss   R ear has been ringing for a long time  Has a little lump in her ear - derm checked it out/ a cyst that is being watched  Patient Active Problem List   Diagnosis Date Noted  . Encounter for routine gynecological examination 10/11/2013  . Other screening mammogram 12/06/2011  . Routine general medical examination at a health care facility 11/26/2011  . FCE NECK&SCLP NO EYE INSECT BITE NONVENOM INF 03/28/2010  . CYST, VULVA 02/28/2010  . KNEE PAIN 02/28/2010  . HYPERLIPIDEMIA 12/08/2008  . TOBACCO USE, QUIT 11/04/2007  . FIBROCYSTIC BREAST DISEASE 10/27/2007  . ECZEMA 10/27/2007   Past Medical History  Diagnosis Date  . Fibrocystic breast   . Tobacco abuse     quit 2011  . Eczema   . Melanoma    Past Surgical History  Procedure Laterality Date  . Pneumonia    . Breast cyst excision    . Tonsillectomy     History  Substance Use Topics  . Smoking status: Former Smoker    Quit date: 11/18/2009  . Smokeless tobacco: Not on file  . Alcohol Use: Yes     Comment: occ   Family History  Problem Relation Age of Onset  . Diabetes Mother   . Hypertension Mother   . Ovarian cysts Mother   . Hypertension Father   . Asthma Father   . Migraines Sister   . Cancer Maternal Grandmother     breast  . Cancer Paternal Grandfather     lung  . Cancer Cousin       melanoma   Allergies  Allergen Reactions  . Neomycin-Bacitracin Zn-Polymyx     REACTION: rash  . Penicillins     REACTION: rash   Current Outpatient Prescriptions on File Prior to Visit  Medication Sig Dispense Refill  . atorvastatin (LIPITOR) 10 MG tablet Take 1 tablet (10 mg total) by mouth daily.  30 tablet  11  . Ibuprofen-Diphenhydramine HCl (ADVIL PM) 200-25 MG CAPS Take 1 capsule by mouth at bedtime as needed.      . sertraline (ZOLOFT) 50 MG tablet Take 1 tablet (50 mg total) by mouth daily.  30 tablet  11   No current facility-administered medications on file prior to visit.     Review of Systems Review of Systems  Constitutional: Negative for fever, appetite change, fatigue and unexpected weight change.  ENt pos for ear pressure w/o pain/ pos for tinnitus/ neg for congestion or sinus pain  Eyes: Negative for pain and visual disturbance.  Respiratory: Negative for cough and shortness of breath.   Cardiovascular: Negative for cp or palpitations    Gastrointestinal: Negative for nausea, diarrhea and constipation.  Genitourinary: Negative for urgency and frequency.  Skin: Negative for pallor or rash   Neurological: Negative for weakness, numbness and headaches pos for intermittent dizziness with pos change .  Hematological: Negative for adenopathy. Does not bruise/bleed easily.  Psychiatric/Behavioral: Negative for dysphoric mood. The patient is not nervous/anxious.         Objective:   Physical Exam  Constitutional: She appears well-developed and well-nourished. No distress.  obese and well appearing  Not dizzy presently  HENT:  Head: Normocephalic and atraumatic.  Right Ear: External ear normal.  Left Ear: External ear normal.  Nose: Nose normal.  Mouth/Throat: Oropharynx is clear and moist.  TMs are clear  Canals are clear   No sinus tenderness  Eyes: Conjunctivae and EOM are normal. Pupils are equal, round, and reactive to light. No scleral icterus.   2-3 beats of horizontal nystagmus bilaterally with reprod of dizziness   Neck: Normal range of motion. Neck supple. No JVD present. No thyromegaly present.  Cardiovascular: Normal rate, regular rhythm, normal heart sounds and intact distal pulses.  Exam reveals no gallop.   Pulmonary/Chest: Effort normal and breath sounds normal. No respiratory distress. She has no wheezes. She has no rales.  Abdominal: Soft. Bowel sounds are normal. She exhibits no distension and no mass. There is no tenderness.  Musculoskeletal: She exhibits no edema.  Lymphadenopathy:    She has no cervical adenopathy.  Neurological: She is alert. She has normal reflexes. She displays no atrophy and no tremor. No cranial nerve deficit or sensory deficit. She exhibits normal muscle tone. Coordination and gait normal.  No focal cerebellar signs  Nl gait-incl tandem/ heel/toe  Skin: Skin is warm and dry. No rash noted. No erythema. No pallor.  Psychiatric: She has a normal mood and affect.          Assessment & Plan:   Problem List Items Addressed This Visit     Nervous and Auditory   Benign paroxysmal positional vertigo     Reassuring exam Pt has ear pressure and tinnitus on and off -suspect some ETD Will tx with flonase 2 -4 wk  Meclizine if needed Update if not starting to improve in a week or if worsening  Would ref to ENT if not better in 2-3 wk      Other   Vertigo - Primary

## 2014-10-23 ENCOUNTER — Other Ambulatory Visit: Payer: Self-pay | Admitting: Family Medicine

## 2014-10-24 NOTE — Telephone Encounter (Signed)
See prev note, please schedule CPE and I will refill medications, thanks

## 2014-10-24 NOTE — Telephone Encounter (Signed)
Please schedule PE and refill until then  

## 2014-10-24 NOTE — Telephone Encounter (Signed)
Electronic refill request, pt had an acute appt on 06/2014, but hasn't had a CPE or f/u since 09/2013, please advise

## 2014-10-27 NOTE — Telephone Encounter (Signed)
LVM on pt home phone to c/b and schedule cpe to continue getting medication refilled

## 2014-10-28 NOTE — Telephone Encounter (Signed)
Please refill each for a month thanks

## 2014-10-28 NOTE — Telephone Encounter (Signed)
Pt called and had to leave a VM on Rena's phone line.  Patient needs her Sertraline and Lipitor filled because she has been out since Monday.  When I called pt back she didn't answer.  I told her to call next week to get her CPE set up.  Please refill meds before the weekend if possible since she hasn't had her medicine since Monday.  Made sure to tell pt on her voicemail that we had tried to reach her before today.

## 2014-11-09 ENCOUNTER — Encounter: Payer: Self-pay | Admitting: Family Medicine

## 2014-11-09 ENCOUNTER — Ambulatory Visit (INDEPENDENT_AMBULATORY_CARE_PROVIDER_SITE_OTHER): Payer: BC Managed Care – PPO | Admitting: Family Medicine

## 2014-11-09 VITALS — BP 110/80 | HR 87 | Temp 98.6°F | Ht 65.0 in | Wt 188.0 lb

## 2014-11-09 DIAGNOSIS — J209 Acute bronchitis, unspecified: Secondary | ICD-10-CM

## 2014-11-09 DIAGNOSIS — Z87891 Personal history of nicotine dependence: Secondary | ICD-10-CM

## 2014-11-09 MED ORDER — HYDROCODONE-HOMATROPINE 5-1.5 MG/5ML PO SYRP: ORAL_SOLUTION | ORAL | Status: DC

## 2014-11-09 MED ORDER — AZITHROMYCIN 250 MG PO TABS: ORAL_TABLET | ORAL | Status: DC

## 2014-11-09 NOTE — Progress Notes (Signed)
Pre visit review using our clinic review tool, if applicable. No additional management support is needed unless otherwise documented below in the visit note. 

## 2014-11-09 NOTE — Progress Notes (Signed)
   Dr. Frederico Hamman T. Shemuel Harkleroad, MD, [facility] [place], [address] Phone: [phone] Fax: [phone]  11/09/2014  Patient: Claudia Carter, MRN: [id number], DOB: 1965-11-19, 48 y.o.  Primary Physician:  Loura Pardon, MD  Chief Complaint: Cough; Sore Throat; Fever; and Fatigue  Subjective:   This 48 y.o. female patient presents with runny nose, sneezing, cough, sore throat, malaise and minimal / low-grade fever .  Bad sore throat and cough, not running a fever. Feeling tired - has been a smoker in the past. Vape cigs now.   ? recent exposure to others with similar symptoms.   The patent denies sore throat as the primary complaint. Denies sthortness of breath/wheezing, high fever, chest pain, rhinits for more than 14 days, significant myalgia, otalgia, facial pain, abdominal pain, changes in bowel or bladder.  PMH, PHS, Allergies, Problem List, Medications, Family History, and Social History have all been reviewed.  ROS as above, eating and drinking - tolerating PO. Urinating normally. No excessive vomitting or diarrhea. O/w as above.  Objective:   Blood pressure 110/80, pulse 87, temperature 98.6 F (37 C), temperature source Oral, height 5\' 5"  (1.651 m), weight 188 lb (85.276 kg), SpO2 97 %.  GEN: WDWN, Non-toxic, Atraumatic, normocephalic. A and O x 3. HEENT: Oropharynx clear without exudate, MMM, no significant LAD, mild rhinnorhea Ears: TM clear, COL visualized with good landmarks CV: RRR, no m/g/r. Pulm: CTA B, no wheezes, rhonchi, or crackles, normal respiratory effort. EXT: no c/c/e Psych: well oriented, neither depressed nor anxious in appearance  Objective Data:  Assessment and Plan:   Acute bronchitis, unspecified organism  TOBACCO USE, QUIT   Supportive care reviewed with patient. See patient instruction section.  Follow-up: No Follow-up on file.  New Prescriptions   AZITHROMYCIN (ZITHROMAX) 250 MG  TABLET    2 tabs po on day 1, then 1 tab po for 4 days   HYDROCODONE-HOMATROPINE (HYCODAN) 5-1.5 MG/5ML SYRUP    1 tsp po at night before bed prn cough   No orders of the defined types were placed in this encounter.    Signed,  Maud Deed. Jodey Burbano, MD   Patient's Medications  New Prescriptions   AZITHROMYCIN (ZITHROMAX) 250 MG TABLET    2 tabs po on day 1, then 1 tab po for 4 days   HYDROCODONE-HOMATROPINE (HYCODAN) 5-1.5 MG/5ML SYRUP    1 tsp po at night before bed prn cough  Previous Medications   ATORVASTATIN (LIPITOR) 10 MG TABLET    TAKE 1 TABLET BY MOUTH DAILY   IBUPROFEN-DIPHENHYDRAMINE HCL (ADVIL PM) 200-25 MG CAPS    Take 1 capsule by mouth at bedtime as needed.   SERTRALINE (ZOLOFT) 50 MG TABLET    TAKE 1 TABLET BY MOUTH DAILY  Modified Medications   No medications on file  Discontinued Medications   FLUTICASONE (FLONASE) 50 MCG/ACT NASAL SPRAY    Place 2 sprays into both nostrils daily.   MECLIZINE (ANTIVERT) 25 MG TABLET    Take 1 tablet (25 mg total) by mouth 3 (three) times daily as needed for dizziness.

## 2014-11-17 ENCOUNTER — Encounter: Payer: Self-pay | Admitting: Family Medicine

## 2014-11-17 ENCOUNTER — Ambulatory Visit (INDEPENDENT_AMBULATORY_CARE_PROVIDER_SITE_OTHER): Payer: BC Managed Care – PPO | Admitting: Family Medicine

## 2014-11-17 VITALS — BP 126/83 | HR 77 | Temp 97.7°F | Resp 18 | Ht 65.0 in | Wt 183.0 lb

## 2014-11-17 DIAGNOSIS — J209 Acute bronchitis, unspecified: Secondary | ICD-10-CM

## 2014-11-17 MED ORDER — PREDNISONE 20 MG PO TABS: ORAL_TABLET | ORAL | Status: DC

## 2014-11-17 NOTE — Progress Notes (Signed)
OFFICE NOTE  11/17/2014  CC:  Chief Complaint  Patient presents with  . Cough    seen on 12/23 with bronchitis  . Wheezing  . Fatigue   HPI: Patient is a 48 y.o. Caucasian female who is here for respiratory illness.   Eleven day history of illness that started with ST and cough, then ST resolved but cough has persisted and pt feels fatigued.  She had Tm 101+ in first couple days of illness but this resolved.  Has rattling cough, but no SOB or chest tightness.  She went to MD during midst of illness and got Z pack and hycodan rx'd and says these made no difference.  No otc cold meds.  Her nose is clear/head not congested.  She is a former smoker who switched to vaps 12/2013.  Pertinent PMH:  I reviewed her PMH and PSH.  MEDS: Not taking azithromycin listed below Outpatient Prescriptions Prior to Visit  Medication Sig Dispense Refill  . atorvastatin (LIPITOR) 10 MG tablet TAKE 1 TABLET BY MOUTH DAILY 30 tablet 0  . HYDROcodone-homatropine (HYCODAN) 5-1.5 MG/5ML syrup 1 tsp po at night before bed prn cough 180 mL 0  . Ibuprofen-Diphenhydramine HCl (ADVIL PM) 200-25 MG CAPS Take 1 capsule by mouth at bedtime as needed.    . sertraline (ZOLOFT) 50 MG tablet TAKE 1 TABLET BY MOUTH DAILY 30 tablet 0  . azithromycin (ZITHROMAX) 250 MG tablet 2 tabs po on day 1, then 1 tab po for 4 days 6 tablet 0   No facility-administered medications prior to visit.    PE: Blood pressure 126/83, pulse 77, temperature 97.7 F (36.5 C), temperature source Temporal, resp. rate 18, height 5\' 5"  (1.651 m), weight 183 lb (83.008 kg), SpO2 96 %. Gen: Alert, well appearing.  Patient is oriented to person, place, time, and situation. ENT: Ears: EACs clear, normal epithelium.  TMs with good light reflex and landmarks bilaterally.  Eyes: no injection, icteris, swelling, or exudate.  EOMI, PERRLA. Nose: no drainage or turbinate edema/swelling.  No injection or focal lesion.  Mouth: lips without lesion/swelling.  Oral  mucosa pink and moist.  Dentition intact and without obvious caries or gingival swelling.  Oropharynx without erythema, exudate, or swelling.  Neck - No masses or thyromegaly or limitation in range of motion CV: RRR, no m/r/g.   LUNGS: CTA bilat on inspiration, with mild coarse end-expiratory wheeze.  Exp phase is not prolonged.  Nonlabored resps, good aeration in all lung fields.   IMPRESSION AND PLAN:  Acute bronchitis, no signif RAD noted. Prednisone 40mg  po qd x 5d, then 20mg  qd x 5d. Mucinex DM qAM, may continue hycodan hs prn (no new rx for this was needed today). Signs/symptoms to call or return for were reviewed and pt expressed understanding.  An After Visit Summary was printed and given to the patient.  FOLLOW UP: prn

## 2014-11-17 NOTE — Patient Instructions (Signed)
Buy Mucinex DM over the counter and use as directed on the packaging.

## 2014-11-17 NOTE — Progress Notes (Signed)
Pre visit review using our clinic review tool, if applicable. No additional management support is needed unless otherwise documented below in the visit note. 

## 2015-03-13 ENCOUNTER — Other Ambulatory Visit: Payer: Self-pay | Admitting: Family Medicine

## 2015-03-13 NOTE — Telephone Encounter (Signed)
Received refill request electronically from pharmacy. Last lipid 10/06/13, last office visit 06/22/14. Is it okay to refill medications?

## 2015-03-13 NOTE — Telephone Encounter (Signed)
She has an appt for PE in the fall  Please ask her to schedule fasting lab for lipid panel ast/alt for hyperlipidemia this spring Then refill med to get by until her PE Thanks

## 2015-03-13 NOTE — Telephone Encounter (Signed)
Left message on voicemail and home number to call back.

## 2015-03-14 NOTE — Telephone Encounter (Signed)
appt scheduled and med refilled 

## 2015-03-15 ENCOUNTER — Other Ambulatory Visit (INDEPENDENT_AMBULATORY_CARE_PROVIDER_SITE_OTHER): Payer: BLUE CROSS/BLUE SHIELD

## 2015-03-15 DIAGNOSIS — E785 Hyperlipidemia, unspecified: Secondary | ICD-10-CM | POA: Diagnosis not present

## 2015-03-15 LAB — LDL CHOLESTEROL, DIRECT: LDL DIRECT: 133 mg/dL

## 2015-03-15 LAB — LIPID PANEL
Cholesterol: 222 mg/dL — ABNORMAL HIGH (ref 0–200)
HDL: 50.4 mg/dL (ref >39.00)
NonHDL: 171.6
Total CHOL/HDL Ratio: 4
Triglycerides: 357 mg/dL — ABNORMAL HIGH (ref 0.0–149.0)
VLDL: 71.4 mg/dL — AB (ref 0.0–40.0)

## 2015-03-15 LAB — HEPATIC FUNCTION PANEL
ALK PHOS: 74 U/L (ref 39–117)
ALT: 14 U/L (ref 0–35)
AST: 15 U/L (ref 0–37)
Albumin: 4.4 g/dL (ref 3.5–5.2)
BILIRUBIN TOTAL: 0.6 mg/dL (ref 0.2–1.2)
Bilirubin, Direct: 0.1 mg/dL (ref 0.0–0.3)
Total Protein: 6.7 g/dL (ref 6.0–8.3)

## 2015-03-16 ENCOUNTER — Telehealth: Payer: Self-pay | Admitting: Family Medicine

## 2015-03-16 NOTE — Telephone Encounter (Signed)
Addressed through lab results  

## 2015-03-16 NOTE — Telephone Encounter (Signed)
Patient returned Shapale's call. °

## 2015-03-22 ENCOUNTER — Ambulatory Visit (INDEPENDENT_AMBULATORY_CARE_PROVIDER_SITE_OTHER): Payer: BLUE CROSS/BLUE SHIELD | Admitting: Internal Medicine

## 2015-03-22 ENCOUNTER — Encounter: Payer: Self-pay | Admitting: Internal Medicine

## 2015-03-22 VITALS — BP 124/80 | HR 80 | Temp 98.3°F | Wt 176.0 lb

## 2015-03-22 DIAGNOSIS — R350 Frequency of micturition: Secondary | ICD-10-CM | POA: Diagnosis not present

## 2015-03-22 DIAGNOSIS — R3915 Urgency of urination: Secondary | ICD-10-CM

## 2015-03-22 DIAGNOSIS — R300 Dysuria: Secondary | ICD-10-CM | POA: Diagnosis not present

## 2015-03-22 LAB — POCT URINALYSIS DIPSTICK
BILIRUBIN UA: NEGATIVE
Glucose, UA: NEGATIVE
KETONES UA: NEGATIVE
NITRITE UA: NEGATIVE
PH UA: 6
Protein, UA: NEGATIVE
RBC UA: NEGATIVE
Urobilinogen, UA: NEGATIVE

## 2015-03-22 MED ORDER — NITROFURANTOIN MONOHYD MACRO 100 MG PO CAPS: 100.0000 mg | ORAL_CAPSULE | Freq: Two times a day (BID) | ORAL | Status: DC

## 2015-03-22 NOTE — Progress Notes (Signed)
HPI  Pt presents to the clinic today with c/o urgency, frequency and dysuria. This started 2 days ago. She denies fever, chills, nausea or low back pain. She has not tried anything OTC. She does not get UTI's frequently. She denies vaginal complaints.   Review of Systems  Past Medical History  Diagnosis Date  . Fibrocystic breast   . Tobacco abuse     quit 2011  . Eczema   . Melanoma     Family History  Problem Relation Age of Onset  . Diabetes Mother   . Hypertension Mother   . Ovarian cysts Mother   . Hypertension Father   . Asthma Father   . Migraines Sister   . Cancer Maternal Grandmother     breast  . Cancer Paternal Grandfather     lung  . Cancer Cousin     melanoma    History   Social History  . Marital Status: Married    Spouse Name: N/A  . Number of Children: 2  . Years of Education: N/A   Occupational History  . [employer]    Social History Main Topics  . Smoking status: Former Smoker    Quit date: 11/18/2009  . Smokeless tobacco: Current User     Comment: E-Vap  . Alcohol Use: 0.0 oz/week    0 Standard drinks or equivalent per week     Comment: occ  . Drug Use: No  . Sexual Activity: Not on file   Other Topics Concern  . Not on file   Social History Narrative    Allergies  Allergen Reactions  . Neomycin-Bacitracin Zn-Polymyx     REACTION: rash  . Penicillins     REACTION: rash    Constitutional: Denies fever, malaise, fatigue, headache or abrupt weight changes.   GU: Pt reports urgency, frequency and pain with urination. Denies burning sensation, blood in urine, odor or discharge. Skin: Denies redness, rashes, lesions or ulcercations.   No other specific complaints in a complete review of systems (except as listed in HPI above).    Objective:   Physical Exam  BP 124/80 mmHg  Pulse 80  Temp(Src) 98.3 F (36.8 C) (Oral)  Wt 176 lb (79.833 kg)  SpO2 98%  Wt Readings from Last 3 Encounters:  03/22/15 176 lb (79.833 kg)  11/17/14  183 lb (83.008 kg)  11/09/14 188 lb (85.276 kg)    General: Appears her stated age, well developed, well nourished in NAD. Cardiovascular: Normal rate and rhythm. S1,S2 noted.  No murmur, rubs or gallops noted.  Pulmonary/Chest: Normal effort and positive vesicular breath sounds. No respiratory distress. No wheezes, rales or ronchi noted.  Abdomen: Soft and nontender. Normal bowel sounds, no bruits noted. No distention or masses noted. Liver, spleen and kidneys non palpable. No CVA tenderness.      Assessment & Plan:   Urgency, Frequency, Dysuria   Urinalysis: 1+ leuks Will send urine culture Will send Rx for Macrobid BID x 5 days OK to take AZO OTC Drink plenty of fluids  RTC as needed or if symptoms persist.

## 2015-03-22 NOTE — Patient Instructions (Signed)

## 2015-03-22 NOTE — Progress Notes (Signed)
Pre visit review using our clinic review tool, if applicable. No additional management support is needed unless otherwise documented below in the visit note. 

## 2015-03-22 NOTE — Addendum Note (Signed)
Addended by: Lurlean Nanny on: 03/22/2015 04:14 PM   Modules accepted: Orders

## 2015-03-24 LAB — URINE CULTURE
COLONY COUNT: NO GROWTH
ORGANISM ID, BACTERIA: NO GROWTH

## 2015-06-16 ENCOUNTER — Telehealth: Payer: Self-pay | Admitting: Family Medicine

## 2015-06-16 ENCOUNTER — Other Ambulatory Visit: Payer: BLUE CROSS/BLUE SHIELD

## 2015-06-16 DIAGNOSIS — Z0000 Encounter for general adult medical examination without abnormal findings: Secondary | ICD-10-CM

## 2015-06-16 NOTE — Telephone Encounter (Signed)
-----   Message from Ellamae Sia sent at 06/07/2015 11:34 AM EDT ----- Regarding: Lab orders for Friday, 7.29.16 Patient is scheduled for CPX labs, please order future labs, Thanks , Karna Christmas

## 2015-08-01 ENCOUNTER — Ambulatory Visit (INDEPENDENT_AMBULATORY_CARE_PROVIDER_SITE_OTHER): Payer: BLUE CROSS/BLUE SHIELD | Admitting: Family Medicine

## 2015-08-01 ENCOUNTER — Encounter: Payer: Self-pay | Admitting: Family Medicine

## 2015-08-01 VITALS — BP 126/82 | HR 72 | Temp 98.6°F | Ht 65.0 in | Wt 184.5 lb

## 2015-08-01 DIAGNOSIS — J209 Acute bronchitis, unspecified: Secondary | ICD-10-CM | POA: Insufficient documentation

## 2015-08-01 MED ORDER — GUAIFENESIN-CODEINE 100-10 MG/5ML PO SYRP: 5.0000 mL | ORAL_SOLUTION | Freq: Every evening | ORAL | Status: DC | PRN

## 2015-08-01 MED ORDER — AZITHROMYCIN 250 MG PO TABS: ORAL_TABLET | ORAL | Status: DC

## 2015-08-01 NOTE — Progress Notes (Signed)
Pre visit review using our clinic review tool, if applicable. No additional management support is needed unless otherwise documented below in the visit note. 

## 2015-08-01 NOTE — Progress Notes (Signed)
   Subjective:    Patient ID: Claudia Carter, female    DOB: February 06, 1966, 49 y.o.   MRN: [id number]  Cough This is a new problem. The current episode started 1 to 4 weeks ago (2 week). The problem has been unchanged. The cough is productive of sputum. Associated symptoms include ear congestion, a fever, nasal congestion, shortness of breath and wheezing. Pertinent negatives include no chills, ear pain, headaches, postnasal drip or sore throat. Associated symptoms comments: Subjective fever in first day of illness  no sinus pressure. The symptoms are aggravated by lying down (keeping her awake at night). Risk factors for lung disease include smoking/tobacco exposure. Treatments tried: nyquil, dayquil. The treatment provided mild relief. Her past medical history is significant for asthma and environmental allergies. There is no history of bronchiectasis, bronchitis, COPD or pneumonia.   occ sneeze, no itchy eyes.    Review of Systems  Constitutional: Positive for fever. Negative for chills.  HENT: Negative for ear pain, postnasal drip and sore throat.   Respiratory: Positive for cough, shortness of breath and wheezing.   Allergic/Immunologic: Positive for environmental allergies.  Neurological: Negative for headaches.       Objective:   Physical Exam  Constitutional: Vital signs are normal. She appears well-developed and well-nourished. She is cooperative.  Non-toxic appearance. She does not appear ill. No distress.  HENT:  Head: Normocephalic.  Right Ear: Hearing, tympanic membrane, external ear and ear canal normal. Tympanic membrane is not erythematous, not retracted and not bulging.  Left Ear: Hearing, tympanic membrane, external ear and ear canal normal. Tympanic membrane is not erythematous, not retracted and not bulging.  Nose: Mucosal edema and rhinorrhea present. Right sinus exhibits no maxillary sinus tenderness and no frontal sinus tenderness. Left sinus exhibits no maxillary sinus  tenderness and no frontal sinus tenderness.  Mouth/Throat: Uvula is midline, oropharynx is clear and moist and mucous membranes are normal.  Eyes: Conjunctivae, EOM and lids are normal. Pupils are equal, round, and reactive to light. Lids are everted and swept, no foreign bodies found.  Neck: Trachea normal and normal range of motion. Neck supple. Carotid bruit is not present. No thyroid mass and no thyromegaly present.  Cardiovascular: Normal rate, regular rhythm, S1 normal, S2 normal, normal heart sounds, intact distal pulses and normal pulses.  Exam reveals no gallop and no friction rub.   No murmur heard. Pulmonary/Chest: Effort normal and breath sounds normal. No tachypnea. No respiratory distress. She has no decreased breath sounds. She has no wheezes. She has no rhonchi. She has no rales.  Neurological: She is alert.  Skin: Skin is warm, dry and intact. No rash noted.  Psychiatric: Her speech is normal and behavior is normal. Judgment normal. Her mood appears not anxious. Cognition and memory are normal. She does not exhibit a depressed mood.          Assessment & Plan:

## 2015-08-01 NOTE — Patient Instructions (Addendum)
Rest, fluids. Cough suppressant at night. Complete antibiotics. Use Mucinex DM durin the day. Quit smoking altogether. Got OT ER if severe shortness pf breath.

## 2015-08-01 NOTE — Assessment & Plan Note (Signed)
Smoker, > 2 weeks of symptoms. Treat with antibiotics, cough suppressant prn.

## 2015-09-06 ENCOUNTER — Ambulatory Visit (INDEPENDENT_AMBULATORY_CARE_PROVIDER_SITE_OTHER): Payer: BLUE CROSS/BLUE SHIELD | Admitting: Family Medicine

## 2015-09-06 ENCOUNTER — Encounter: Payer: Self-pay | Admitting: Family Medicine

## 2015-09-06 VITALS — BP 110/78 | HR 84 | Temp 98.1°F | Ht 65.5 in | Wt 180.8 lb

## 2015-09-06 DIAGNOSIS — Z23 Encounter for immunization: Secondary | ICD-10-CM

## 2015-09-06 DIAGNOSIS — Z0000 Encounter for general adult medical examination without abnormal findings: Secondary | ICD-10-CM | POA: Diagnosis not present

## 2015-09-06 DIAGNOSIS — E785 Hyperlipidemia, unspecified: Secondary | ICD-10-CM | POA: Diagnosis not present

## 2015-09-06 DIAGNOSIS — Z1211 Encounter for screening for malignant neoplasm of colon: Secondary | ICD-10-CM | POA: Insufficient documentation

## 2015-09-06 MED ORDER — SERTRALINE HCL 50 MG PO TABS: 50.0000 mg | ORAL_TABLET | Freq: Every day | ORAL | Status: DC

## 2015-09-06 MED ORDER — ATORVASTATIN CALCIUM 10 MG PO TABS: 10.0000 mg | ORAL_TABLET | Freq: Every day | ORAL | Status: DC

## 2015-09-06 NOTE — Progress Notes (Signed)
Subjective:    Patient ID: Claudia Carter, female    DOB: 1966-06-04, 49 y.o.   MRN: [id number]  HPI Here for health maintenance exam and to review chronic medical problems    Is taking care of yourself  Exercise -was better / slacked off  Riding bikes and walking Loves food  Tries to eat smart -avoiding junk food / slips up occasionally   Wt is down 4 lb with bmi of 29   HIV = declines/ no risk factors   Mm 1/15 neg  Self exam -no lumps   Pap 11/14 nl  No gyn problems  Periods are less frequent now - last one in Aug  Some hot flashes and night sweats - worst at night  Wants to wait another year for her pap /gyn exam   Colon cancer screening  No family hx of colon cancer   Flu shot -had today   Tdap 1/13  (needs tdap)   Due for labs today  Hx of high cholesterol  Lab Results  Component Value Date   CHOL 222* 03/15/2015   HDL 50.40 03/15/2015   LDLCALC 82 01/17/2012   LDLDIRECT 133.0 03/15/2015   TRIG 357.0* 03/15/2015   CHOLHDL 4 03/15/2015    Has been good about low sat fat diet  Last ate chicken salad sandwich  and kettle chips    Patient Active Problem List   Diagnosis Date Noted  . Acute bronchitis 08/01/2015  . Vertigo 06/22/2014  . Benign paroxysmal positional vertigo 06/22/2014  . Encounter for routine gynecological examination 10/11/2013  . Other screening mammogram 12/06/2011  . Routine general medical examination at a health care facility 11/26/2011  . FCE NECK&SCLP NO EYE INSECT BITE NONVENOM INF 03/28/2010  . CYST, VULVA 02/28/2010  . KNEE PAIN 02/28/2010  . Hyperlipidemia 12/08/2008  . TOBACCO USE, QUIT 11/04/2007  . FIBROCYSTIC BREAST DISEASE 10/27/2007  . ECZEMA 10/27/2007   Past Medical History  Diagnosis Date  . Fibrocystic breast   . Tobacco abuse     quit 2011  . Eczema   . Melanoma [facility])    Past Surgical History  Procedure Laterality Date  . Pneumonia    . Breast cyst excision    . Tonsillectomy     Social History    Substance Use Topics  . Smoking status: Former Smoker    Quit date: 11/18/2009  . Smokeless tobacco: Current User     Comment: E-Vap  . Alcohol Use: 0.0 oz/week    0 Standard drinks or equivalent per week     Comment: occ   Family History  Problem Relation Age of Onset  . Diabetes Mother   . Hypertension Mother   . Ovarian cysts Mother   . Hypertension Father   . Asthma Father   . Migraines Sister   . Cancer Maternal Grandmother     breast  . Cancer Paternal Grandfather     lung  . Cancer Cousin     melanoma   Allergies  Allergen Reactions  . Neomycin-Bacitracin Zn-Polymyx     REACTION: rash  . Penicillins     REACTION: rash   Current Outpatient Prescriptions on File Prior to Visit  Medication Sig Dispense Refill  . atorvastatin (LIPITOR) 10 MG tablet TAKE 1 TABLET BY MOUTH DAILY 30 tablet 5  . Ibuprofen-Diphenhydramine HCl (ADVIL PM) 200-25 MG CAPS Take 1 capsule by mouth at bedtime as needed.    . sertraline (ZOLOFT) 50 MG tablet TAKE 1 TABLET BY MOUTH  DAILY 30 tablet 5   No current facility-administered medications on file prior to visit.    Review of Systems Review of Systems  Constitutional: Negative for fever, appetite change, fatigue and unexpected weight change.  Eyes: Negative for pain and visual disturbance.  Respiratory: Negative for cough and shortness of breath.   Cardiovascular: Negative for cp or palpitations    Gastrointestinal: Negative for nausea, diarrhea and constipation.  Genitourinary: Negative for urgency and frequency.  Skin: Negative for pallor or rash   Neurological: Negative for weakness, light-headedness, numbness and headaches.  Hematological: Negative for adenopathy. Does not bruise/bleed easily.  Psychiatric/Behavioral: Negative for dysphoric mood. The patient is not nervous/anxious.         Objective:   Physical Exam  Constitutional: She appears well-developed and well-nourished. No distress.  obese and well appearing   HENT:   Head: Normocephalic and atraumatic.  Right Ear: External ear normal.  Left Ear: External ear normal.  Mouth/Throat: Oropharynx is clear and moist.  Eyes: Conjunctivae and EOM are normal. Pupils are equal, round, and reactive to light. No scleral icterus.  Neck: Normal range of motion. Neck supple. No JVD present. Carotid bruit is not present. No thyromegaly present.  Cardiovascular: Normal rate, regular rhythm, normal heart sounds and intact distal pulses.  Exam reveals no gallop.   Pulmonary/Chest: Effort normal and breath sounds normal. No respiratory distress. She has no wheezes. She exhibits no tenderness.  Abdominal: Soft. Bowel sounds are normal. She exhibits no distension, no abdominal bruit and no mass. There is no tenderness.  Genitourinary: No breast swelling, tenderness, discharge or bleeding.  Breast exam: No mass, nodules, thickening, tenderness, bulging, retraction, inflamation, nipple discharge or skin changes noted.  No axillary or clavicular LA.      Musculoskeletal: Normal range of motion. She exhibits no edema or tenderness.  Lymphadenopathy:    She has no cervical adenopathy.  Neurological: She is alert. She has normal reflexes. No cranial nerve deficit. She exhibits normal muscle tone. Coordination normal.  Skin: Skin is warm and dry. No rash noted. No erythema. No pallor.  Stable brown nevi on back and trunk  Psychiatric: She has a normal mood and affect.          Assessment & Plan:   Problem List Items Addressed This Visit      Other   Colon cancer screening    Ref for first screening colonoscopy        Relevant Orders   Ambulatory referral to Gastroenterology   Hyperlipidemia    High trig last time Disc limiting fat and sugar in diet  Will plan a fasting draw to re check that       Relevant Medications   atorvastatin (LIPITOR) 10 MG tablet   Routine general medical examination at a health care facility - Primary    Reviewed health habits  including diet and exercise and skin cancer prevention Reviewed appropriate screening tests for age  Also reviewed health mt list, fam hx and immunization status , as well as social and family history   See HPI Labs planned - fasting  Don't forget to schedule your own mammogram  We will do a referral for a colonoscopy and get back to you to schedule that  Please schedule a fasting lab appointment when you can For cholesterol  ( Avoid red meat/ fried foods/ egg yolks/ fatty breakfast meats/ butter, cheese and high fat dairy/ and shellfish)  Try to exercise 5 days per week  Relevant Orders   CBC with Differential/Platelet   Comprehensive metabolic panel   Lipid panel   TSH    Other Visit Diagnoses    Need for prophylactic vaccination and inoculation against influenza        Relevant Orders    Flu Vaccine QUAD 36+ mos IM (Completed)

## 2015-09-06 NOTE — Progress Notes (Signed)
Pre visit review using our clinic review tool, if applicable. No additional management support is needed unless otherwise documented below in the visit note. 

## 2015-09-06 NOTE — Patient Instructions (Signed)
Don't forget to schedule your own mammogram  We will do a referral for a colonoscopy and get back to you to schedule that  Please schedule a fasting lab appointment when you can For cholesterol  ( Avoid red meat/ fried foods/ egg yolks/ fatty breakfast meats/ butter, cheese and high fat dairy/ and shellfish)  Try to exercise 5 days per week

## 2015-09-07 NOTE — Assessment & Plan Note (Signed)
Reviewed health habits including diet and exercise and skin cancer prevention Reviewed appropriate screening tests for age  Also reviewed health mt list, fam hx and immunization status , as well as social and family history   See HPI Labs planned - fasting  Don't forget to schedule your own mammogram  We will do a referral for a colonoscopy and get back to you to schedule that  Please schedule a fasting lab appointment when you can For cholesterol  ( Avoid red meat/ fried foods/ egg yolks/ fatty breakfast meats/ butter, cheese and high fat dairy/ and shellfish)  Try to exercise 5 days per week

## 2015-09-07 NOTE — Assessment & Plan Note (Signed)
Ref for first screening colonoscopy  

## 2015-09-07 NOTE — Assessment & Plan Note (Signed)
High trig last time Disc limiting fat and sugar in diet  Will plan a fasting draw to re check that

## 2015-09-11 ENCOUNTER — Other Ambulatory Visit: Payer: BLUE CROSS/BLUE SHIELD

## 2015-09-19 ENCOUNTER — Encounter: Payer: Self-pay | Admitting: Gastroenterology

## 2015-10-27 ENCOUNTER — Ambulatory Visit (AMBULATORY_SURGERY_CENTER): Payer: Self-pay

## 2015-10-27 VITALS — Ht 65.0 in | Wt 187.0 lb

## 2015-10-27 DIAGNOSIS — Z1211 Encounter for screening for malignant neoplasm of colon: Secondary | ICD-10-CM

## 2015-10-27 MED ORDER — SUPREP BOWEL PREP KIT 17.5-3.13-1.6 GM/177ML PO SOLN: 1.0000 | Freq: Once | ORAL | Status: DC

## 2015-10-27 NOTE — Progress Notes (Signed)
No allergies to eggs or soy or flu shot No diet/weight loss meds No home oxygen No past problems with anesthesia  Has email and internet; registered emmi

## 2015-10-30 ENCOUNTER — Encounter: Payer: Self-pay | Admitting: Gastroenterology

## 2015-11-08 ENCOUNTER — Telehealth: Payer: Self-pay | Admitting: Gastroenterology

## 2015-11-08 NOTE — Telephone Encounter (Signed)
No charge this time. 

## 2015-11-10 ENCOUNTER — Encounter: Payer: BLUE CROSS/BLUE SHIELD | Admitting: Gastroenterology

## 2015-11-16 ENCOUNTER — Ambulatory Visit (INDEPENDENT_AMBULATORY_CARE_PROVIDER_SITE_OTHER): Payer: BLUE CROSS/BLUE SHIELD | Admitting: Internal Medicine

## 2015-11-16 ENCOUNTER — Encounter: Payer: Self-pay | Admitting: Internal Medicine

## 2015-11-16 VITALS — BP 118/74 | HR 84 | Temp 98.1°F | Wt 186.0 lb

## 2015-11-16 DIAGNOSIS — J069 Acute upper respiratory infection, unspecified: Secondary | ICD-10-CM

## 2015-11-16 MED ORDER — HYDROCODONE-HOMATROPINE 5-1.5 MG/5ML PO SYRP: 5.0000 mL | ORAL_SOLUTION | Freq: Three times a day (TID) | ORAL | Status: DC | PRN

## 2015-11-16 MED ORDER — AZITHROMYCIN 250 MG PO TABS: ORAL_TABLET | ORAL | Status: DC

## 2015-11-16 NOTE — Progress Notes (Signed)
HPI  Pt presents to the clinic today with c/o fatigue, cough and chest congestion. This started 1 week ago. The cough is productive of green mucous. The cough seems worse at night. She denies shortness of breath. She has run low grade fever, but denies chills or body aches. She has no history of allergies or breathing problems. She does get bronchitis a few times a year. She is vaping. She has not had sick contacts that she is aware of.  Review of Systems      Past Medical History  Diagnosis Date  . Fibrocystic breast   . Tobacco abuse     quit 2011  . Eczema     Family History  Problem Relation Age of Onset  . Diabetes Mother   . Hypertension Mother   . Ovarian cysts Mother   . Hypertension Father   . Asthma Father   . Migraines Sister   . Cancer Maternal Grandmother     breast  . Cancer Paternal Grandfather     lung  . Cancer Cousin     melanoma  . Colon cancer Neg Hx     Social History   Social History  . Marital Status: Married    Spouse Name: N/A  . Number of Children: 2  . Years of Education: N/A   Occupational History  . [employer]    Social History Main Topics  . Smoking status: Current Every Day Smoker    Last Attempt to Quit: 11/18/2009  . Smokeless tobacco: Current User     Comment: E-Vap  . Alcohol Use: 0.0 oz/week    0 Standard drinks or equivalent per week     Comment: occ  . Drug Use: No  . Sexual Activity: Not on file   Other Topics Concern  . Not on file   Social History Narrative    Allergies  Allergen Reactions  . Neomycin-Bacitracin Zn-Polymyx     REACTION: rash  . Penicillins     REACTION: rash     Constitutional: Positive headache, fatigue and fever. Denies abrupt weight changes.  HEENT:  Positive sore throat. Denies eye redness, eye pain, pressure behind the eyes, facial pain, nasal congestion, ear pain, ringing in the ears, wax buildup, runny nose or bloody nose. Respiratory: Positive cough. Denies difficulty breathing or  shortness of breath.  Cardiovascular: Denies chest pain, chest tightness, palpitations or swelling in the hands or feet.   No other specific complaints in a complete review of systems (except as listed in HPI above).  Objective:   BP 118/74 mmHg  Pulse 84  Temp(Src) 98.1 F (36.7 C) (Oral)  Wt 186 lb (84.369 kg)  SpO2 97%  Wt Readings from Last 3 Encounters:  11/16/15 186 lb (84.369 kg)  10/27/15 187 lb (84.823 kg)  09/06/15 180 lb 12 oz (81.988 kg)     General: Appears her stated age, well developed, well nourished in NAD. HEENT: Head: normal shape and size; Eyes: sclera white, no icterus, conjunctiva pink; Ears: Tm's gray and intact, normal light reflex;  Throat/Mouth: Teeth present, mucosa erythematous and moist, no exudate noted, no lesions or ulcerations noted.  Neck: Cervical lymphadenopathy.  Cardiovascular: Normal rate and rhythm. S1,S2 noted.  No murmur, rubs or gallops noted.  Pulmonary/Chest: Normal effort and positive vesicular breath sounds. No respiratory distress. No wheezes, rales or ronchi noted.      Assessment & Plan:   Upper Respiratory Infection:  Get some rest and drink plenty of water Do salt water  gargles for the sore throat eRx for Azithromax x 5 days Rx for Hycodan cough syrup  RTC as needed or if symptoms persist.

## 2015-11-16 NOTE — Patient Instructions (Signed)
Upper Respiratory Infection, Adult Most upper respiratory infections (URIs) are a viral infection of the air passages leading to the lungs. A URI affects the nose, throat, and upper air passages. The most common type of URI is nasopharyngitis and is typically referred to as "the common cold." URIs run their course and usually go away on their own. Most of the time, a URI does not require medical attention, but sometimes a bacterial infection in the upper airways can follow a viral infection. This is called a secondary infection. Sinus and middle ear infections are common types of secondary upper respiratory infections. Bacterial pneumonia can also complicate a URI. A URI can worsen asthma and chronic obstructive pulmonary disease (COPD). Sometimes, these complications can require emergency medical care and may be life threatening.  CAUSES Almost all URIs are caused by viruses. A virus is a type of germ and can spread from one person to another.  RISKS FACTORS You may be at risk for a URI if:   You smoke.   You have chronic heart or lung disease.  You have a weakened defense (immune) system.   You are very young or very old.   You have nasal allergies or asthma.  You work in crowded or poorly ventilated areas.  You work in health care facilities or schools. SIGNS AND SYMPTOMS  Symptoms typically develop 2-3 days after you come in contact with a cold virus. Most viral URIs last 7-10 days. However, viral URIs from the influenza virus (flu virus) can last 14-18 days and are typically more severe. Symptoms may include:   Runny or stuffy (congested) nose.   Sneezing.   Cough.   Sore throat.   Headache.   Fatigue.   Fever.   Loss of appetite.   Pain in your forehead, behind your eyes, and over your cheekbones (sinus pain).  Muscle aches.  DIAGNOSIS  Your health care provider may diagnose a URI by:  Physical exam.  Tests to check that your symptoms are not due to  another condition such as:  Strep throat.  Sinusitis.  Pneumonia.  Asthma. TREATMENT  A URI goes away on its own with time. It cannot be cured with medicines, but medicines may be prescribed or recommended to relieve symptoms. Medicines may help:  Reduce your fever.  Reduce your cough.  Relieve nasal congestion. HOME CARE INSTRUCTIONS   Take medicines only as directed by your health care provider.   Gargle warm saltwater or take cough drops to comfort your throat as directed by your health care provider.  Use a warm mist humidifier or inhale steam from a shower to increase air moisture. This may make it easier to breathe.  Drink enough fluid to keep your urine clear or pale yellow.   Eat soups and other clear broths and maintain good nutrition.   Rest as needed.   Return to work when your temperature has returned to normal or as your health care provider advises. You may need to stay home longer to avoid infecting others. You can also use a face mask and careful hand washing to prevent spread of the virus.  Increase the usage of your inhaler if you have asthma.   Do not use any tobacco products, including cigarettes, chewing tobacco, or electronic cigarettes. If you need help quitting, ask your health care provider. PREVENTION  The best way to protect yourself from getting a cold is to practice good hygiene.   Avoid oral or hand contact with people with cold   symptoms.   Wash your hands often if contact occurs.  There is no clear evidence that vitamin C, vitamin E, echinacea, or exercise reduces the chance of developing a cold. However, it is always recommended to get plenty of rest, exercise, and practice good nutrition.  SEEK MEDICAL CARE IF:   You are getting worse rather than better.   Your symptoms are not controlled by medicine.   You have chills.  You have worsening shortness of breath.  You have brown or red mucus.  You have yellow or brown nasal  discharge.  You have pain in your face, especially when you bend forward.  You have a fever.  You have swollen neck glands.  You have pain while swallowing.  You have white areas in the back of your throat. SEEK IMMEDIATE MEDICAL CARE IF:   You have severe or persistent:  Headache.  Ear pain.  Sinus pain.  Chest pain.  You have chronic lung disease and any of the following:  Wheezing.  Prolonged cough.  Coughing up blood.  A change in your usual mucus.  You have a stiff neck.  You have changes in your:  Vision.  Hearing.  Thinking.  Mood. MAKE SURE YOU:   Understand these instructions.  Will watch your condition.  Will get help right away if you are not doing well or get worse.   This information is not intended to replace advice given to you by your health care provider. Make sure you discuss any questions you have with your health care provider.   Document Released: 04/30/2001 Document Revised: 03/21/2015 Document Reviewed: 02/09/2014 Elsevier Interactive Patient Education 2016 [employer]  

## 2015-11-16 NOTE — Progress Notes (Signed)
Pre visit review using our clinic review tool, if applicable. No additional management support is needed unless otherwise documented below in the visit note. 

## 2015-12-21 ENCOUNTER — Ambulatory Visit (AMBULATORY_SURGERY_CENTER): Payer: BLUE CROSS/BLUE SHIELD | Admitting: Gastroenterology

## 2015-12-21 ENCOUNTER — Encounter: Payer: Self-pay | Admitting: Gastroenterology

## 2015-12-21 VITALS — BP 123/73 | HR 79 | Temp 97.4°F | Resp 13 | Ht 65.0 in | Wt 187.0 lb

## 2015-12-21 DIAGNOSIS — Z1211 Encounter for screening for malignant neoplasm of colon: Secondary | ICD-10-CM | POA: Diagnosis not present

## 2015-12-21 DIAGNOSIS — D125 Benign neoplasm of sigmoid colon: Secondary | ICD-10-CM | POA: Diagnosis not present

## 2015-12-21 DIAGNOSIS — K635 Polyp of colon: Secondary | ICD-10-CM

## 2015-12-21 DIAGNOSIS — D123 Benign neoplasm of transverse colon: Secondary | ICD-10-CM

## 2015-12-21 MED ORDER — SODIUM CHLORIDE 0.9 % IV SOLN: 500.0000 mL | INTRAVENOUS | Status: DC

## 2015-12-21 NOTE — Progress Notes (Signed)
Report to PACU, RN, vss, BBS= Clear.  

## 2015-12-21 NOTE — Patient Instructions (Signed)
YOU HAD AN ENDOSCOPIC PROCEDURE TODAY AT [facility]:   Refer to the procedure report that was given to you for any specific questions about what was found during the examination.  If the procedure report does not answer your questions, please call your gastroenterologist to clarify.  If you requested that your care partner not be given the details of your procedure findings, then the procedure report has been included in a sealed envelope for you to review at your convenience later.  YOU SHOULD EXPECT: Some feelings of bloating in the abdomen. Passage of more gas than usual.  Walking can help get rid of the air that was put into your GI tract during the procedure and reduce the bloating. If you had a lower endoscopy (such as a colonoscopy or flexible sigmoidoscopy) you may notice spotting of blood in your stool or on the toilet paper. If you underwent a bowel prep for your procedure, you may not have a normal bowel movement for a few days.  Please Note:  You might notice some irritation and congestion in your nose or some drainage.  This is from the oxygen used during your procedure.  There is no need for concern and it should clear up in a day or so.  SYMPTOMS TO REPORT IMMEDIATELY:   Following lower endoscopy (colonoscopy or flexible sigmoidoscopy):  Excessive amounts of blood in the stool  Significant tenderness or worsening of abdominal pains  Swelling of the abdomen that is new, acute  Fever of 100F or higher  For urgent or emergent issues, a gastroenterologist can be reached at any hour by calling [phone].   DIET: Your first meal following the procedure should be a small meal and then it is ok to progress to your normal diet. Heavy or fried foods are harder to digest and may make you feel nauseous or bloated.  Likewise, meals heavy in dairy and vegetables can increase bloating.  Drink plenty of fluids but you should avoid alcoholic beverages for 24  hours.  ACTIVITY:  You should plan to take it easy for the rest of today and you should NOT DRIVE or use heavy machinery until tomorrow (because of the sedation medicines used during the test).    FOLLOW UP: Our staff will call the number listed on your records the next business day following your procedure to check on you and address any questions or concerns that you may have regarding the information given to you following your procedure. If we do not reach you, we will leave a message.  However, if you are feeling well and you are not experiencing any problems, there is no need to return our call.  We will assume that you have returned to your regular daily activities without incident.  If any biopsies were taken you will be contacted by phone or by letter within the next 1-3 weeks.  Please call us at [phone] if you have not heard about the biopsies in 3 weeks.    SIGNATURES/CONFIDENTIALITY: You and/or your care partner have signed paperwork which will be entered into your electronic medical record.  These signatures attest to the fact that that the information above on your After Visit Summary has been reviewed and is understood.  Full responsibility of the confidentiality of this discharge information lies with you and/or your care-partner.  Next colonoscopy determined by pathology results; 5 or 10 years. Please review polyp, diverticulosis, high fiber diet, and hemorrhoid handouts provided.

## 2015-12-21 NOTE — Progress Notes (Signed)
Called to room to assist during endoscopic procedure.  Patient ID and intended procedure confirmed with present staff. Received instructions for my participation in the procedure from the performing physician.  

## 2015-12-21 NOTE — Op Note (Signed)
[facility]  [address] [place], [address]   COLONOSCOPY PROCEDURE REPORT  PATIENT: Claudia, Carter  MR#: [id number] BIRTHDATE: 22-Oct-1966 , 49  yrs. old GENDER: female ENDOSCOPIST: Ladene Artist, MD, [facility] REFERRED BY:  Abner Greenspan, M.D. PROCEDURE DATE:  12/21/2015 PROCEDURE:   Colonoscopy, screening and Colonoscopy with snare polypectomy First Screening Colonoscopy - Avg.  risk and is 50 yrs.  old or older Yes.  Prior Negative Screening - Now for repeat screening. N/A  History of Adenoma - Now for follow-up colonoscopy & has been > or = to 3 yrs.  N/A  Polyps removed today? Yes ASA CLASS:   Class II INDICATIONS:Screening for colonic neoplasia and Colorectal Neoplasm Risk Assessment for this procedure is average risk. MEDICATIONS: Monitored anesthesia care and Propofol 200 mg IV DESCRIPTION OF PROCEDURE:   After the risks benefits and alternatives of the procedure were thoroughly explained, informed consent was obtained.  The digital rectal exam revealed no abnormalities of the rectum.   The LB [id number]-H190 [id number]  endoscope was introduced through the anus and advanced to the cecum, which was identified by both the appendix and ileocecal valve. No adverse events experienced.   The quality of the prep was excellent. (Suprep was used)  The instrument was then slowly withdrawn as the colon was fully examined. Estimated blood loss is zero unless otherwise noted in this procedure report.    COLON FINDINGS: Two sessile polyps measuring 7 mm in size were found in the sigmoid colon and transverse colon.  Polypectomies were performed with a cold snare.  The resection was complete, the polyp tissue was completely retrieved and sent to histology.   There was moderate diverticulosis noted in the sigmoid colon with associated muscular hypertrophy.   The examination was otherwise normal. Retroflexed views revealed no abnormalities. The time to cecum = 2.2 Withdrawal  time = 11.6   The scope was withdrawn and the procedure completed. COMPLICATIONS: There were no immediate complications.  ENDOSCOPIC IMPRESSION: 1.   Two sessile polyps in the sigmoid colon and transverse colon; polypectomies performed with a cold snare 2.   Moderate diverticulosis noted in the sigmoid colon 3.   Grade l internal hemorrhoids  RECOMMENDATIONS: 1.  High fiber diet with liberal fluid intake. 2.  Repeat colonoscopy in 5 years if polyp(s) adenomatous; otherwise 10 years  eSigned:  Ladene Artist, MD, [facility] 12/21/2015 10:37 AM

## 2015-12-22 ENCOUNTER — Telehealth: Payer: Self-pay | Admitting: *Deleted

## 2015-12-22 NOTE — Telephone Encounter (Signed)
  Follow up Call-  Call back number 12/21/2015  Post procedure Call Back phone  # [phone] hm  Permission to leave phone message Yes     Patient questions:  Do you have a fever, pain , or abdominal swelling? No. Pain Score  0 *  Have you tolerated food without any problems? Yes.    Have you been able to return to your normal activities? Yes.    Do you have any questions about your discharge instructions: Diet   No. Medications  No. Follow up visit  No.  Do you have questions or concerns about your Care? No.  Actions: * If pain score is 4 or above: No action needed, pain <4. Spoke with pt's husband ,who said pt has gone to work and did not have any problems yesterday after got home from procedure.

## 2016-01-01 ENCOUNTER — Encounter: Payer: Self-pay | Admitting: Gastroenterology

## 2016-09-22 ENCOUNTER — Other Ambulatory Visit: Payer: Self-pay | Admitting: Family Medicine

## 2016-09-23 NOTE — Telephone Encounter (Signed)
No recent/future appts., please advise  

## 2016-09-23 NOTE — Telephone Encounter (Signed)
Please schedule PE in the early spring and refill until then

## 2016-09-25 NOTE — Telephone Encounter (Signed)
Left voicemail requesting pt to call the office back 

## 2016-10-01 NOTE — Telephone Encounter (Signed)
CPE scheduled and med refilled  

## 2016-11-01 ENCOUNTER — Encounter: Payer: BLUE CROSS/BLUE SHIELD | Admitting: Family Medicine

## 2016-11-01 ENCOUNTER — Telehealth: Payer: Self-pay | Admitting: Family Medicine

## 2016-11-01 DIAGNOSIS — Z0289 Encounter for other administrative examinations: Secondary | ICD-10-CM

## 2016-11-01 NOTE — Telephone Encounter (Signed)
Patient did not come in for their appointment today for cpe.  Please let me know if patient needs to be contacted immediately for follow up or no follow up needed. °

## 2016-11-01 NOTE — Telephone Encounter (Signed)
Re schedule when able Thanks

## 2016-11-07 ENCOUNTER — Encounter: Payer: Self-pay | Admitting: Family Medicine

## 2016-11-07 NOTE — Telephone Encounter (Signed)
Sent letter to reschedule appt.

## 2016-12-02 ENCOUNTER — Encounter: Payer: BLUE CROSS/BLUE SHIELD | Admitting: Family Medicine

## 2016-12-05 ENCOUNTER — Other Ambulatory Visit: Payer: Self-pay | Admitting: Family Medicine

## 2016-12-09 ENCOUNTER — Other Ambulatory Visit: Payer: Self-pay | Admitting: Family Medicine

## 2016-12-20 ENCOUNTER — Other Ambulatory Visit (HOSPITAL_COMMUNITY)
Admission: RE | Admit: 2016-12-20 | Discharge: 2016-12-20 | Disposition: A | Discharge status: Home / Self Care | Payer: BLUE CROSS/BLUE SHIELD | Source: Ambulatory Visit | Attending: Family Medicine | Admitting: Family Medicine

## 2016-12-20 ENCOUNTER — Ambulatory Visit (INDEPENDENT_AMBULATORY_CARE_PROVIDER_SITE_OTHER): Payer: BLUE CROSS/BLUE SHIELD | Admitting: Family Medicine

## 2016-12-20 ENCOUNTER — Encounter: Payer: BLUE CROSS/BLUE SHIELD | Admitting: Family Medicine

## 2016-12-20 ENCOUNTER — Encounter: Payer: Self-pay | Admitting: Family Medicine

## 2016-12-20 VITALS — BP 106/74 | HR 82 | Temp 98.6°F | Ht 65.75 in | Wt 187.5 lb

## 2016-12-20 DIAGNOSIS — Z0000 Encounter for general adult medical examination without abnormal findings: Secondary | ICD-10-CM

## 2016-12-20 DIAGNOSIS — Z23 Encounter for immunization: Secondary | ICD-10-CM

## 2016-12-20 DIAGNOSIS — Z1211 Encounter for screening for malignant neoplasm of colon: Secondary | ICD-10-CM

## 2016-12-20 DIAGNOSIS — Z1151 Encounter for screening for human papillomavirus (HPV): Secondary | ICD-10-CM | POA: Diagnosis not present

## 2016-12-20 DIAGNOSIS — E7800 Pure hypercholesterolemia, unspecified: Secondary | ICD-10-CM

## 2016-12-20 DIAGNOSIS — Z1231 Encounter for screening mammogram for malignant neoplasm of breast: Secondary | ICD-10-CM | POA: Diagnosis not present

## 2016-12-20 DIAGNOSIS — F17200 Nicotine dependence, unspecified, uncomplicated: Secondary | ICD-10-CM

## 2016-12-20 DIAGNOSIS — Z01419 Encounter for gynecological examination (general) (routine) without abnormal findings: Secondary | ICD-10-CM | POA: Diagnosis not present

## 2016-12-20 LAB — CBC WITH DIFFERENTIAL/PLATELET
BASOS PCT: 1.1 % (ref 0.0–3.0)
Basophils Absolute: 0.1 10*3/uL (ref 0.0–0.1)
EOS PCT: 9 % — AB (ref 0.0–5.0)
Eosinophils Absolute: 0.5 10*3/uL (ref 0.0–0.7)
HCT: 42.5 % (ref 36.0–46.0)
HEMOGLOBIN: 14.8 g/dL (ref 12.0–15.0)
LYMPHS ABS: 1.6 10*3/uL (ref 0.7–4.0)
Lymphocytes Relative: 26 % (ref 12.0–46.0)
MCHC: 34.8 g/dL (ref 30.0–36.0)
MCV: 93.5 fl (ref 78.0–100.0)
MONOS PCT: 5.4 % (ref 3.0–12.0)
Monocytes Absolute: 0.3 10*3/uL (ref 0.1–1.0)
Neutro Abs: 3.5 10*3/uL (ref 1.4–7.7)
Neutrophils Relative %: 58.5 % (ref 43.0–77.0)
Platelets: 226 10*3/uL (ref 150.0–400.0)
RBC: 4.54 Mil/uL (ref 3.87–5.11)
RDW: 12.6 % (ref 11.5–15.5)
WBC: 6 10*3/uL (ref 4.0–10.5)

## 2016-12-20 LAB — LIPID PANEL
CHOL/HDL RATIO: 4
CHOLESTEROL: 191 mg/dL (ref 0–200)
HDL: 51.2 mg/dL (ref >39.00)
NonHDL: 139.99
Triglycerides: 207 mg/dL — ABNORMAL HIGH (ref 0.0–149.0)
VLDL: 41.4 mg/dL — AB (ref 0.0–40.0)

## 2016-12-20 LAB — COMPREHENSIVE METABOLIC PANEL
ALBUMIN: 4.8 g/dL (ref 3.5–5.2)
ALK PHOS: 81 U/L (ref 39–117)
ALT: 22 U/L (ref 0–35)
AST: 17 U/L (ref 0–37)
BUN: 15 mg/dL (ref 6–23)
CO2: 28 mEq/L (ref 19–32)
CREATININE: 0.77 mg/dL (ref 0.40–1.20)
Calcium: 9.7 mg/dL (ref 8.4–10.5)
Chloride: 106 mEq/L (ref 96–112)
GFR: 83.8 mL/min (ref >60.00)
GLUCOSE: 104 mg/dL — AB (ref 70–99)
POTASSIUM: 4.2 meq/L (ref 3.5–5.1)
SODIUM: 139 meq/L (ref 135–145)
TOTAL PROTEIN: 7.5 g/dL (ref 6.0–8.3)
Total Bilirubin: 0.6 mg/dL (ref 0.2–1.2)

## 2016-12-20 LAB — TSH: TSH: 1.51 u[IU]/mL (ref 0.35–4.50)

## 2016-12-20 LAB — LDL CHOLESTEROL, DIRECT: LDL DIRECT: 109 mg/dL

## 2016-12-20 LAB — HM PAP SMEAR: HM Pap smear: NORMAL

## 2016-12-20 MED ORDER — ALPRAZOLAM 0.5 MG PO TABS: 0.5000 mg | ORAL_TABLET | Freq: Two times a day (BID) | ORAL | 0 refills | Status: DC | PRN

## 2016-12-20 MED ORDER — ATORVASTATIN CALCIUM 10 MG PO TABS
10.0000 mg | ORAL_TABLET | Freq: Every day | ORAL | 11 refills | Status: DC
Start: 2016-12-20 — End: 2017-12-29

## 2016-12-20 MED ORDER — SERTRALINE HCL 50 MG PO TABS: 50.0000 mg | ORAL_TABLET | Freq: Every day | ORAL | 11 refills | Status: DC

## 2016-12-20 NOTE — Progress Notes (Signed)
Subjective:    Patient ID: Claudia Carter, female    DOB: 1966-08-06, 51 y.o.   MRN: [id number]  HPI Here for health maintenance exam and to review chronic medical problems    Working a lot  Is a new grandmother - with another one due in May   Taking fair care of herself   Wt Readings from Last 3 Encounters:  12/20/16 187 lb 8 oz (85 kg)  12/21/15 187 lb (84.8 kg)  11/16/15 186 lb (84.4 kg)  stable  Not watching diet and not exercising  bmi 30.4  Will be very busy traveling for work - going to [place]  Needs to sleep on the plane  Benadryl    Hep C/ HIV screening - not high risk/ declines screening  Mammogram 1/15 nl  Needs to go for one -overdue- she goes to the breast center -would like a referral  Self breast exam -no lumps   Pap/gyn care  Pap 11/14 normal Due for a pap  No menses since may - menopausal with hot flashes and night sweats  Tubes tied  No vaginal symptoms    Flu vaccine -wants to get today  Tetanus vaccine 1/13  Colonoscopy 2/17 hyperplastic polyp - 10 year recall   Smoking status -1/2 ppd  Has quit in the past cold [place]   Hx of hyperlipidemia Lab Results  Component Value Date   CHOL 222 (H) 03/15/2015   HDL 50.40 03/15/2015   LDLCALC 82 01/17/2012   LDLDIRECT 133.0 03/15/2015   TRIG 357.0 (H) 03/15/2015   CHOLHDL 4 03/15/2015   On atorvastatin and diet  No missed doses for the most part  Not eating a lot fat diet     Patient Active Problem List   Diagnosis Date Noted  . Screening mammogram, encounter for 12/20/2016  . Colon cancer screening 09/06/2015  . Encounter for routine gynecological examination 10/11/2013  . Other screening mammogram 12/06/2011  . Routine general medical examination at a health care facility 11/26/2011  . KNEE PAIN 02/28/2010  . Hyperlipidemia 12/08/2008  . Smoker 11/04/2007  . FIBROCYSTIC BREAST DISEASE 10/27/2007  . ECZEMA 10/27/2007   Past Medical History:  Diagnosis Date  . Allergy   .  Anxiety   . Eczema   . Fibrocystic breast   . Hyperlipidemia   . Pneumonia   . 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 maw  . Tobacco abuse    quit 2011   Past Surgical History:  Procedure Laterality Date  . BREAST CYST EXCISION    . pneumonia    . TONSILLECTOMY    . TUBAL LIGATION     Social History  Substance Use Topics  . Smoking status: Current Every Day Smoker    Packs/day: 0.50    Types: Cigarettes  . Smokeless tobacco: Current User     Comment: E-Vap  . Alcohol use 0.0 oz/week     Comment: occ   Family History  Problem Relation Age of Onset  . Diabetes Mother   . Hypertension Mother   . Ovarian cysts Mother   . Hypertension Father   . Asthma Father   . Migraines Sister   . Cancer Maternal Grandmother     breast  . Cancer Paternal Grandfather     lung  . Cancer Cousin     melanoma  . Colon cancer Neg Hx   . Esophageal cancer Neg Hx   . Stomach  cancer Neg Hx   . Rectal cancer Neg Hx    Allergies  Allergen Reactions  . Adhesive [Tape]     Per the pt , "when I were tape for a long time, days, it breaks out my skin". maw  . Neomycin-Bacitracin Zn-Polymyx     REACTION: rash  . Penicillins     REACTION: rash   Current Outpatient Prescriptions on File Prior to Visit  Medication Sig Dispense Refill  . diphenhydrAMINE (BENADRYL) 25 MG tablet Take 25 mg by mouth at bedtime.     No current facility-administered medications on file prior to visit.     Review of Systems Review of Systems  Constitutional: Negative for fever, appetite change, and unexpected weight change. pos for fatigue from a busy travel schedule  Eyes: Negative for pain and visual disturbance.  Respiratory: Negative for cough and shortness of breath.   Cardiovascular: Negative for cp or palpitations    Gastrointestinal: Negative for nausea, diarrhea and constipation.  Genitourinary: Negative for urgency and frequency. neg for vaginal d/c or pelvic pain    Skin: Negative for pallor or rash   Neurological: Negative for weakness, light-headedness, numbness and headaches.  Hematological: Negative for adenopathy. Does not bruise/bleed easily.  Psychiatric/Behavioral: Negative for dysphoric mood. The patient is not nervous/anxious.         Objective:   Physical Exam  Constitutional: She appears well-developed and well-nourished. No distress.  obese and well appearing   HENT:  Head: Normocephalic and atraumatic.  Right Ear: External ear normal.  Left Ear: External ear normal.  Mouth/Throat: Oropharynx is clear and moist.  Eyes: Conjunctivae and EOM are normal. Pupils are equal, round, and reactive to light. No scleral icterus.  Neck: Normal range of motion. Neck supple. No JVD present. Carotid bruit is not present. No thyromegaly present.  Cardiovascular: Normal rate, regular rhythm, normal heart sounds and intact distal pulses.  Exam reveals no gallop.   Pulmonary/Chest: Effort normal and breath sounds normal. No respiratory distress. She has no wheezes. She exhibits no tenderness.  Diffusely distant bs Good air exch  Abdominal: Soft. Bowel sounds are normal. She exhibits no distension, no abdominal bruit and no mass. There is no tenderness.  Genitourinary: No breast swelling, tenderness, discharge or bleeding.  Genitourinary Comments: Breast exam: No mass, nodules, thickening, tenderness, bulging, retraction, inflamation, nipple discharge or skin changes noted.  No axillary or clavicular LA.             Anus appears normal w/o hemorrhoids or masses     External genitalia : nl appearance and hair distribution/no lesions     Urethral meatus : nl size, no lesions or prolapse     Urethra: no masses, tenderness or scarring    Bladder : no masses or tenderness     Vagina: nl general appearance, no discharge or  Lesions, no significant cystocele  or rectocele     Cervix: no lesions/ discharge or friability    Uterus: nl  size, contour, position, and mobility (not fixed) , non tender    Adnexa : no masses, tenderness, enlargement or nodularity        Musculoskeletal: Normal range of motion. She exhibits no edema or tenderness.  Lymphadenopathy:    She has no cervical adenopathy.  Neurological: She is alert. She has normal reflexes. No cranial nerve deficit. She exhibits normal muscle tone. Coordination normal.  Skin: Skin is warm and dry. No rash noted. No erythema. No pallor.  Lentigines diffusely  Dry  skin   Psychiatric: She has a normal mood and affect.          Assessment & Plan:   Problem List Items Addressed This Visit      Other   Colon cancer screening   Encounter for routine gynecological examination    Routine exam w/o complaints or abnormalities No menses since 5/17 -suspect menopausal  She is tolerating vasomotor symptoms       Relevant Orders   Cytology - PAP   Hyperlipidemia    Disc goals for lipids and reasons to control them Rev labs with pt  (from last check)  Rev low sat fat diet in detail  Continues atorvastatin and diet  Not currently eating low fat diet      Relevant Medications   atorvastatin (LIPITOR) 10 MG tablet   Routine general medical examination at a health care facility - Primary    Reviewed health habits including diet and exercise and skin cancer prevention Reviewed appropriate screening tests for age  Also reviewed health mt list, fam hx and immunization status , as well as social and family history   See HPI Labs drawn today  Colonoscopy is utd Gyn exam - post menopausal suspect  Ref for mammogram  Declines Hep C/HIV screening due to low risk status  Enc smoking cessation and also wt loss        Relevant Orders   CBC with Differential/Platelet (Completed)   Comprehensive metabolic panel (Completed)   Lipid panel (Completed)   TSH (Completed)   Screening mammogram, encounter for    Scheduled annual screening mammogram Nl breast exam  today  Encouraged monthly self exams        Relevant Orders   MM DIGITAL SCREENING BILATERAL   Smoker    She is smoking 1/2 ppd  Plans to quit cold [place] when she is ready-this is how she quit in the past Disc in detail risks of smoking and possible outcomes including copd, vascular/ heart disease, cancer , respiratory and sinus infections  Pt voices understanding        Other Visit Diagnoses    Need for influenza vaccination       Relevant Orders   Flu Vaccine QUAD 36+ mos IM (Completed)

## 2016-12-20 NOTE — Patient Instructions (Addendum)
Try the xanax for airplane travel anxeity-do not drive with it /caution of sedation   Pick a date to quit smoking and stick with it  Exercise- goal 5 days per week 30 or more minutes   Stop at check out for ref for mammogam   For cholesterol :    Avoid red meat/ fried foods/ egg yolks/ fatty breakfast meats/ butter, cheese and high fat dairy/ and shellfish    Labs today  Flu shot today

## 2016-12-20 NOTE — Progress Notes (Signed)
Pre visit review using our clinic review tool, if applicable. No additional management support is needed unless otherwise documented below in the visit note. 

## 2016-12-22 NOTE — Assessment & Plan Note (Addendum)
Reviewed health habits including diet and exercise and skin cancer prevention Reviewed appropriate screening tests for age  Also reviewed health mt list, fam hx and immunization status , as well as social and family history   See HPI Labs drawn today  Colonoscopy is utd Gyn exam - post menopausal suspect  Ref for mammogram  Declines Hep C/HIV screening due to low risk status  Enc smoking cessation and also wt loss

## 2016-12-22 NOTE — Assessment & Plan Note (Signed)
Disc goals for lipids and reasons to control them Rev labs with pt  (from last check)  Rev low sat fat diet in detail  Continues atorvastatin and diet  Not currently eating low fat diet

## 2016-12-22 NOTE — Assessment & Plan Note (Signed)
Routine exam w/o complaints or abnormalities No menses since 5/17 -suspect menopausal  She is tolerating vasomotor symptoms

## 2016-12-22 NOTE — Assessment & Plan Note (Signed)
Scheduled annual screening mammogram Nl breast exam today  Encouraged monthly self exams   

## 2016-12-22 NOTE — Assessment & Plan Note (Signed)
She is smoking 1/2 ppd  Plans to quit cold [place] when she is ready-this is how she quit in the past Disc in detail risks of smoking and possible outcomes including copd, vascular/ heart disease, cancer , respiratory and sinus infections  Pt voices understanding

## 2016-12-23 ENCOUNTER — Encounter: Payer: Self-pay | Admitting: *Deleted

## 2016-12-25 LAB — CYTOLOGY - PAP
Diagnosis: NEGATIVE
HPV: NOT DETECTED

## 2016-12-26 ENCOUNTER — Encounter: Payer: Self-pay | Admitting: *Deleted

## 2017-01-10 ENCOUNTER — Encounter: Payer: BLUE CROSS/BLUE SHIELD | Admitting: Family Medicine

## 2017-02-25 ENCOUNTER — Encounter: Payer: Self-pay | Admitting: Family Medicine

## 2017-02-25 ENCOUNTER — Ambulatory Visit (INDEPENDENT_AMBULATORY_CARE_PROVIDER_SITE_OTHER): Payer: BLUE CROSS/BLUE SHIELD | Admitting: Family Medicine

## 2017-02-25 VITALS — BP 108/76 | HR 78 | Temp 98.5°F | Ht 65.5 in | Wt 183.0 lb

## 2017-02-25 DIAGNOSIS — F17200 Nicotine dependence, unspecified, uncomplicated: Secondary | ICD-10-CM

## 2017-02-25 DIAGNOSIS — J209 Acute bronchitis, unspecified: Secondary | ICD-10-CM | POA: Diagnosis not present

## 2017-02-25 MED ORDER — BENZONATATE 200 MG PO CAPS: 200.0000 mg | ORAL_CAPSULE | Freq: Three times a day (TID) | ORAL | 1 refills | Status: DC | PRN

## 2017-02-25 MED ORDER — HYDROCODONE-HOMATROPINE 5-1.5 MG/5ML PO SYRP: 5.0000 mL | ORAL_SOLUTION | Freq: Every evening | ORAL | 0 refills | Status: DC | PRN

## 2017-02-25 MED ORDER — PREDNISONE 10 MG PO TABS: ORAL_TABLET | ORAL | 0 refills | Status: DC

## 2017-02-25 NOTE — Assessment & Plan Note (Signed)
In smoker with mild reactive airways  tx with low dose prednisone taper (side eff disc) Fluids/rest Tessalon for cough  Hycodan for pm cough  Expectorant prn congestion  Update if not starting to improve in a week or if worsening

## 2017-02-25 NOTE — Progress Notes (Signed)
Pre visit review using our clinic review tool, if applicable. No additional management support is needed unless otherwise documented below in the visit note. 

## 2017-02-25 NOTE — Assessment & Plan Note (Signed)
Disc in detail risks of smoking and possible outcomes including copd, vascular/ heart disease, cancer , respiratory and sinus infections  Pt voices understanding  

## 2017-02-25 NOTE — Progress Notes (Signed)
Subjective:    Patient ID: Claudia Carter, female    DOB: 07-Sep-1966, 51 y.o.   MRN: [id number]  HPI Here for cough   Husband had bronchitis and gave it to her  Symptoms started 3 d ago  Getting worse quickly  Bad cough- prod small amt of sputum Wheezing and some sob  Had to leave work today   No fever   otc- takes nyquil and tylenol   Small amt of nasal cong No ST or ear pain   5-6 cig per day Getting ready to quit   Patient Active Problem List   Diagnosis Date Noted  . Acute bronchitis 02/25/2017  . Screening mammogram, encounter for 12/20/2016  . Colon cancer screening 09/06/2015  . Encounter for routine gynecological examination 10/11/2013  . Other screening mammogram 12/06/2011  . Routine general medical examination at a health care facility 11/26/2011  . KNEE PAIN 02/28/2010  . Hyperlipidemia 12/08/2008  . Smoker 11/04/2007  . FIBROCYSTIC BREAST DISEASE 10/27/2007  . ECZEMA 10/27/2007   Past Medical History:  Diagnosis Date  . Allergy   . Anxiety   . Eczema   . Fibrocystic breast   . Hyperlipidemia   . Pneumonia   . Seizures ([place])    per pt had 1 seizure at age 16 and 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 maw  . Tobacco abuse    quit 2011   Past Surgical History:  Procedure Laterality Date  . BREAST CYST EXCISION    . pneumonia    . TONSILLECTOMY    . TUBAL LIGATION     Social History  Substance Use Topics  . Smoking status: Current Every Day Smoker    Packs/day: 0.50    Types: Cigarettes  . Smokeless tobacco: Current User     Comment: E-Vap  . Alcohol use 0.0 oz/week     Comment: occ   Family History  Problem Relation Age of Onset  . Diabetes Mother   . Hypertension Mother   . Ovarian cysts Mother   . Hypertension Father   . Asthma Father   . Migraines Sister   . Cancer Maternal Grandmother     breast  . Cancer Paternal Grandfather     lung  . Cancer Cousin     melanoma  . Colon cancer Neg Hx   . Esophageal cancer Neg Hx   . Stomach cancer  Neg Hx   . Rectal cancer Neg Hx    Allergies  Allergen Reactions  . Adhesive [Tape]     Per the pt , "when I were tape for a long time, days, it breaks out my skin". maw  . Neomycin-Bacitracin Zn-Polymyx     REACTION: rash  . Penicillins     REACTION: rash   Current Outpatient Prescriptions on File Prior to Visit  Medication Sig Dispense Refill  . ALPRAZolam (XANAX) 0.5 MG tablet Take 1 tablet (0.5 mg total) by mouth 2 (two) times daily as needed for anxiety or sleep. For airplane trip anxiety 15 tablet 0  . atorvastatin (LIPITOR) 10 MG tablet Take 1 tablet (10 mg total) by mouth daily. 30 tablet 11  . diphenhydrAMINE (BENADRYL) 25 MG tablet Take 25 mg by mouth at bedtime.    . sertraline (ZOLOFT) 50 MG tablet Take 1 tablet (50 mg total) by mouth daily. 30 tablet 11   No current facility-administered medications on file prior to visit.      Review of Systems Review of Systems  Constitutional: Negative for fever, appetite  change, and unexpected weight change.  ENT pos for mild nasal congestion  Eyes: Negative for pain and visual disturbance.  Respiratory: pos for cough and shortness of breath.   Cardiovascular: Negative for cp or palpitations    Gastrointestinal: Negative for nausea, diarrhea and constipation.  Genitourinary: Negative for urgency and frequency.  Skin: Negative for pallor or rash   Neurological: Negative for weakness, light-headedness, numbness and headaches.  Hematological: Negative for adenopathy. Does not bruise/bleed easily.  Psychiatric/Behavioral: Negative for dysphoric mood. The patient is not nervous/anxious.         Objective:   Physical Exam  Constitutional: She appears well-developed and well-nourished. No distress.  obese and well appearing   HENT:  Head: Normocephalic and atraumatic.  Right Ear: External ear normal.  Left Ear: External ear normal.  Mouth/Throat: Oropharynx is clear and moist.  Nares are injected and congested  No sinus  tenderness Clear rhinorrhea and post nasal drip   Eyes: Conjunctivae and EOM are normal. Pupils are equal, round, and reactive to light. Right eye exhibits no discharge. Left eye exhibits no discharge.  Neck: Normal range of motion. Neck supple.  Cardiovascular: Normal rate and normal heart sounds.   Pulmonary/Chest: Effort normal. No respiratory distress. She has wheezes. She has no rales. She exhibits no tenderness.  Harsh bs with scattered rhonchi  Mild wheezing on forced expiration -no prolonged exp phase No rales or crackles   Lymphadenopathy:    She has no cervical adenopathy.  Neurological: She is alert. A cranial nerve deficit is present.  Skin: Skin is warm and dry. No rash noted. No pallor.  Psychiatric: She has a normal mood and affect.          Assessment & Plan:   Problem List Items Addressed This Visit      Respiratory   Acute bronchitis    In smoker with mild reactive airways  tx with low dose prednisone taper (side eff disc) Fluids/rest Tessalon for cough  Hycodan for pm cough  Expectorant prn congestion  Update if not starting to improve in a week or if worsening          Other   Smoker - Primary    Disc in detail risks of smoking and possible outcomes including copd, vascular/ heart disease, cancer , respiratory and sinus infections  Pt voices understanding

## 2017-02-25 NOTE — Patient Instructions (Signed)
I think you have viral bronchitis  Drink lots of fluids and rest  Robitussin or mucinex otc is ok to loosen congestion  Hycodan for pm cough-caution of sedation  Prednisone for wheezing and reactive airways Tessalon for cough three times per day   Update if not starting to improve in a week or if worsening    Think about quitting smoking while you are getting over this also

## 2017-12-29 ENCOUNTER — Other Ambulatory Visit: Payer: Self-pay | Admitting: Family Medicine

## 2017-12-30 NOTE — Telephone Encounter (Signed)
Pt had an acute appt in 02/2017 but no recent or future appts., please advise

## 2017-12-30 NOTE — Telephone Encounter (Signed)
Please schedule a PE and refill until then Thanks

## 2017-12-31 NOTE — Telephone Encounter (Signed)
Pt schedule cpe for 2/26 and is requesting the rx be sent to:  [id number] - [place],  [place] [phone] (Phone)

## 2018-01-07 ENCOUNTER — Encounter: Payer: Self-pay | Admitting: Family Medicine

## 2018-01-07 ENCOUNTER — Ambulatory Visit: Payer: BLUE CROSS/BLUE SHIELD | Admitting: Family Medicine

## 2018-01-07 VITALS — BP 124/76 | HR 83 | Temp 98.0°F | Ht 65.5 in | Wt 180.5 lb

## 2018-01-07 DIAGNOSIS — N950 Postmenopausal bleeding: Secondary | ICD-10-CM | POA: Insufficient documentation

## 2018-01-07 NOTE — Progress Notes (Signed)
Subjective:    Patient ID: Claudia Carter, female    DOB: 1966-11-03, 52 y.o.   MRN: [id number]  HPI Here for post menopausal bleeding   Driving home from work the other day - felt a little pelvic cramp  Got up the following night to use restroom and there was blood  Cramping followed that  Still bleeding mildly - but not severe Feels tired and hormonal  Has been bleeding for 2 days     Wt Readings from Last 3 Encounters:  01/07/18 180 lb 8 oz (81.9 kg)  02/25/17 183 lb (83 kg)  12/20/16 187 lb 8 oz (85 kg)  wt loss of 7 lb   (has been cutting back)  No new exercise  29.58 kg/m   Pap test was done 2/18 -neg with neg HPV test    Stopped menses may of 2017  Was irregular for a while before that  Hot flashes and night sweats   Patient Active Problem List   Diagnosis Date Noted  . Post-menopausal bleeding 01/07/2018  . Screening mammogram, encounter for 12/20/2016  . Colon cancer screening 09/06/2015  . Encounter for routine gynecological examination 10/11/2013  . Other screening mammogram 12/06/2011  . Routine general medical examination at a health care facility 11/26/2011  . KNEE PAIN 02/28/2010  . Hyperlipidemia 12/08/2008  . Smoker 11/04/2007  . FIBROCYSTIC BREAST DISEASE 10/27/2007  . ECZEMA 10/27/2007   Past Medical History:  Diagnosis Date  . Allergy   . Anxiety   . Eczema   . Fibrocystic breast   . Hyperlipidemia   . Pneumonia   . Seizures ([place])    per pt had 1 seizure at age 49 and 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 maw  . Tobacco abuse    quit 2011   Past Surgical History:  Procedure Laterality Date  . BREAST CYST EXCISION    . pneumonia    . TONSILLECTOMY    . TUBAL LIGATION     Social History   Tobacco Use  . Smoking status: Current Every Day Smoker    Packs/day: 0.50    Types: Cigarettes  . Smokeless tobacco: Current User  . Tobacco comment: E-Vap  Substance Use Topics  . Alcohol use: Yes    Alcohol/week: 0.0 oz    Comment: occ  . Drug use:  No   Family History  Problem Relation Age of Onset  . Diabetes Mother   . Hypertension Mother   . Ovarian cysts Mother   . Hypertension Father   . Asthma Father   . Migraines Sister   . Cancer Maternal Grandmother        breast  . Cancer Paternal Grandfather        lung  . Cancer Cousin        melanoma  . Colon cancer Neg Hx   . Esophageal cancer Neg Hx   . Stomach cancer Neg Hx   . Rectal cancer Neg Hx    Allergies  Allergen Reactions  . Adhesive [Tape]     Per the pt , "when I were tape for a long time, days, it breaks out my skin". maw  . Neomycin-Bacitracin Zn-Polymyx     REACTION: rash  . Penicillins     REACTION: rash   Current Outpatient Medications on File Prior to Visit  Medication Sig Dispense Refill  . atorvastatin (LIPITOR) 10 MG tablet TAKE 1 TABLET BY MOUTH DAILY 30 tablet 0  . diphenhydrAMINE (BENADRYL) 25 MG tablet Take 25 mg  by mouth at bedtime.    . sertraline (ZOLOFT) 50 MG tablet TAKE 1 TABLET BY MOUTH DAILY 30 tablet 0   No current facility-administered medications on file prior to visit.     Review of Systems  Constitutional: Negative for activity change, appetite change, fatigue, fever and unexpected weight change.  HENT: Negative for congestion, ear pain, rhinorrhea, sinus pressure and sore throat.   Eyes: Negative for pain, redness and visual disturbance.  Respiratory: Negative for cough, shortness of breath and wheezing.   Cardiovascular: Negative for chest pain and palpitations.  Gastrointestinal: Negative for abdominal pain, blood in stool, constipation and diarrhea.  Endocrine: Negative for polydipsia and polyuria.  Genitourinary: Positive for pelvic pain and vaginal bleeding. Negative for difficulty urinating, dyspareunia, dysuria, flank pain, frequency, urgency, vaginal discharge and vaginal pain.  Musculoskeletal: Negative for arthralgias, back pain and myalgias.  Skin: Negative for pallor and rash.  Allergic/Immunologic: Negative for  environmental allergies.  Neurological: Negative for dizziness, syncope and headaches.  Hematological: Negative for adenopathy. Does not bruise/bleed easily.  Psychiatric/Behavioral: Negative for decreased concentration and dysphoric mood. The patient is not nervous/anxious.        Objective:   Physical Exam  Constitutional: She appears well-developed and well-nourished. No distress.  obese and well appearing   HENT:  Head: Normocephalic and atraumatic.  Mouth/Throat: Oropharynx is clear and moist.  Eyes: Conjunctivae and EOM are normal. Pupils are equal, round, and reactive to light. No scleral icterus.  Neck: Normal range of motion. Neck supple.  Cardiovascular: Normal rate, regular rhythm and normal heart sounds.  Pulmonary/Chest: Effort normal and breath sounds normal. No respiratory distress. She has no wheezes.  Abdominal: Soft. Bowel sounds are normal. She exhibits no distension and no mass. There is tenderness. There is no rebound and no guarding.  Mild suprapubic tenderness w/o rebound or guarding   Lymphadenopathy:    She has no cervical adenopathy.  Neurological: She is alert.  Skin: Skin is warm and dry. No rash noted. No erythema. No pallor.  Psychiatric: She has a normal mood and affect.          Assessment & Plan:   Problem List Items Addressed This Visit      Other   Post-menopausal bleeding    52 yo , 2 y since last menses (with vasomotor symptoms at the time)  Light bleeding for several days with cramps and some fatigue  Exam today-blood at os and no polyp or lesion seen Check FSH and LH today in light of age /symptoms Ref to gyn for further eval-likely US/poss endo bx  Pt voiced understanding  Will alert [place] if symptoms worsen or change       Relevant Orders   FSH/LH   Ambulatory referral to Gynecology

## 2018-01-07 NOTE — Patient Instructions (Signed)
Lab today for [facility] and LH (hormones) Alert me if bleeding or pain worsen  We will refer you to gyn

## 2018-01-07 NOTE — Assessment & Plan Note (Signed)
52 yo , 2 y since last menses (with vasomotor symptoms at the time)  Light bleeding for several days with cramps and some fatigue  Exam today-blood at os and no polyp or lesion seen Check FSH and LH today in light of age /symptoms Ref to gyn for further eval-likely US/poss endo bx  Pt voiced understanding  Will alert [place] if symptoms worsen or change

## 2018-01-08 LAB — FSH/LH
FSH: 49.7 m[IU]/mL
LH: 16.8 m[IU]/mL

## 2018-01-11 ENCOUNTER — Telehealth: Payer: Self-pay | Admitting: Family Medicine

## 2018-01-11 DIAGNOSIS — E7800 Pure hypercholesterolemia, unspecified: Secondary | ICD-10-CM

## 2018-01-11 DIAGNOSIS — Z0000 Encounter for general adult medical examination without abnormal findings: Secondary | ICD-10-CM

## 2018-01-11 NOTE — Telephone Encounter (Signed)
-----   Message from Ellamae Sia sent at 01/07/2018 10:21 AM EST ----- Regarding: Lab orders for Tuesday, 2.26.19 Patient is scheduled for CPX labs, please order future labs, Thanks , Karna Christmas

## 2018-01-13 ENCOUNTER — Encounter: Payer: Self-pay | Admitting: Family Medicine

## 2018-01-13 ENCOUNTER — Other Ambulatory Visit: Payer: Self-pay

## 2018-01-23 ENCOUNTER — Ambulatory Visit (INDEPENDENT_AMBULATORY_CARE_PROVIDER_SITE_OTHER): Payer: BLUE CROSS/BLUE SHIELD | Admitting: Family Medicine

## 2018-01-23 ENCOUNTER — Encounter: Payer: Self-pay | Admitting: Family Medicine

## 2018-01-23 ENCOUNTER — Other Ambulatory Visit: Payer: Self-pay

## 2018-01-23 VITALS — BP 106/72 | HR 82 | Temp 98.2°F | Ht 65.75 in | Wt 178.5 lb

## 2018-01-23 DIAGNOSIS — Z1231 Encounter for screening mammogram for malignant neoplasm of breast: Secondary | ICD-10-CM | POA: Diagnosis not present

## 2018-01-23 DIAGNOSIS — E7800 Pure hypercholesterolemia, unspecified: Secondary | ICD-10-CM

## 2018-01-23 DIAGNOSIS — Z0000 Encounter for general adult medical examination without abnormal findings: Secondary | ICD-10-CM | POA: Diagnosis not present

## 2018-01-23 DIAGNOSIS — N950 Postmenopausal bleeding: Secondary | ICD-10-CM

## 2018-01-23 DIAGNOSIS — Z23 Encounter for immunization: Secondary | ICD-10-CM | POA: Diagnosis not present

## 2018-01-23 DIAGNOSIS — F17200 Nicotine dependence, unspecified, uncomplicated: Secondary | ICD-10-CM | POA: Diagnosis not present

## 2018-01-23 LAB — CBC WITH DIFFERENTIAL/PLATELET
BASOS ABS: 0 10*3/uL (ref 0.0–0.1)
BASOS PCT: 0.7 % (ref 0.0–3.0)
Eosinophils Absolute: 0.3 10*3/uL (ref 0.0–0.7)
Eosinophils Relative: 7.2 % — ABNORMAL HIGH (ref 0.0–5.0)
HEMATOCRIT: 42.6 % (ref 36.0–46.0)
Hemoglobin: 14.7 g/dL (ref 12.0–15.0)
LYMPHS PCT: 31.9 % (ref 12.0–46.0)
Lymphs Abs: 1.5 10*3/uL (ref 0.7–4.0)
MCHC: 34.6 g/dL (ref 30.0–36.0)
MCV: 95.5 fl (ref 78.0–100.0)
MONO ABS: 0.3 10*3/uL (ref 0.1–1.0)
Monocytes Relative: 5.8 % (ref 3.0–12.0)
Neutro Abs: 2.5 10*3/uL (ref 1.4–7.7)
Neutrophils Relative %: 54.4 % (ref 43.0–77.0)
Platelets: 218 10*3/uL (ref 150.0–400.0)
RBC: 4.46 Mil/uL (ref 3.87–5.11)
RDW: 12.8 % (ref 11.5–15.5)
WBC: 4.7 10*3/uL (ref 4.0–10.5)

## 2018-01-23 LAB — COMPREHENSIVE METABOLIC PANEL
ALBUMIN: 4.4 g/dL (ref 3.5–5.2)
ALT: 17 U/L (ref 0–35)
AST: 17 U/L (ref 0–37)
Alkaline Phosphatase: 76 U/L (ref 39–117)
BILIRUBIN TOTAL: 0.6 mg/dL (ref 0.2–1.2)
BUN: 15 mg/dL (ref 6–23)
CALCIUM: 9.6 mg/dL (ref 8.4–10.5)
CHLORIDE: 102 meq/L (ref 96–112)
CO2: 28 mEq/L (ref 19–32)
CREATININE: 0.74 mg/dL (ref 0.40–1.20)
GFR: 87.36 mL/min (ref >60.00)
Glucose, Bld: 103 mg/dL — ABNORMAL HIGH (ref 70–99)
Potassium: 3.9 mEq/L (ref 3.5–5.1)
SODIUM: 138 meq/L (ref 135–145)
TOTAL PROTEIN: 6.9 g/dL (ref 6.0–8.3)

## 2018-01-23 LAB — LIPID PANEL
CHOL/HDL RATIO: 4
Cholesterol: 173 mg/dL (ref 0–200)
HDL: 45.6 mg/dL (ref >39.00)
NONHDL: 127.81
Triglycerides: 299 mg/dL — ABNORMAL HIGH (ref 0.0–149.0)
VLDL: 59.8 mg/dL — AB (ref 0.0–40.0)

## 2018-01-23 LAB — TSH: TSH: 1.56 u[IU]/mL (ref 0.35–4.50)

## 2018-01-23 LAB — LDL CHOLESTEROL, DIRECT: LDL DIRECT: 91 mg/dL

## 2018-01-23 MED ORDER — ATORVASTATIN CALCIUM 10 MG PO TABS: 10.0000 mg | ORAL_TABLET | Freq: Every day | ORAL | 11 refills | Status: DC

## 2018-01-23 MED ORDER — SERTRALINE HCL 50 MG PO TABS: 50.0000 mg | ORAL_TABLET | Freq: Every day | ORAL | 11 refills | Status: DC

## 2018-01-23 NOTE — Assessment & Plan Note (Signed)
Lipid panel today  Disc goals for lipids and reasons to control them Rev labs with pt (last time) Rev low sat fat diet in detail  Better diet  Atorvastatin

## 2018-01-23 NOTE — Assessment & Plan Note (Signed)
Scheduled annual screening mammogram Nl breast exam today  Encouraged monthly self exams   

## 2018-01-23 NOTE — Assessment & Plan Note (Signed)
No further bleeding Rev labs  F/u with gyn Tuesday as planned

## 2018-01-23 NOTE — Patient Instructions (Addendum)
We will refer you for a mammogram   Flu shot today  Labs today   Keep thinking about quitting smoking    Keep working on diet/exercise/weight loss-you are doing well!

## 2018-01-23 NOTE — Assessment & Plan Note (Signed)
Disc in detail risks of smoking and possible outcomes including copd, vascular/ heart disease, cancer , respiratory and sinus infections  Pt voices understanding Pt is almost ready to quit-plans to do cold [place]  Support offered

## 2018-01-23 NOTE — Progress Notes (Signed)
Subjective:    Patient ID: Claudia Carter, female    DOB: 1966/07/05, 52 y.o.   MRN: [id number]  HPI Here for health maintenance exam and to review chronic medical problems    Working a lot  Taking care of herself   Wt Readings from Last 3 Encounters:  01/23/18 178 lb 8 oz (81 kg)  01/07/18 180 lb 8 oz (81.9 kg)  02/25/17 183 lb (83 kg)  has worked hard on weight loss  29.03 kg/m   Mammogram -has not had one - declines them in the past  She goes to the breast center  Self breast exam   Pap 2/18 nl   Post menopausal bleeding at last visit = has appt on Tuesday No more bleeding since the last visit   Flu vaccine - will have today  Tetanus vaccine 1/13  colonosocpy 2/17 hyperplastic polyp with 10 y recall   Smoking status - 1/2 ppd now  She is mentally prep herself to quit - cold [place] when she does it  Has done it before   Hyperlipidemia Lab Results  Component Value Date   CHOL 191 12/20/2016   HDL 51.20 12/20/2016   LDLCALC 82 01/17/2012   LDLDIRECT 109.0 12/20/2016   TRIG 207.0 (H) 12/20/2016   CHOLHDL 4 12/20/2016   Atorvastatin and diet  Does watch her diet  Does eat eggs -not excessively   She has been eating less in general - stops when full   Needs labs today  Patient Active Problem List   Diagnosis Date Noted  . Post-menopausal bleeding 01/07/2018  . Screening mammogram, encounter for 12/20/2016  . Colon cancer screening 09/06/2015  . Encounter for routine gynecological examination 10/11/2013  . Routine general medical examination at a health care facility 11/26/2011  . Hyperlipidemia 12/08/2008  . Smoker 11/04/2007  . FIBROCYSTIC BREAST DISEASE 10/27/2007  . ECZEMA 10/27/2007   Past Medical History:  Diagnosis Date  . Allergy   . Anxiety   . Eczema   . Fibrocystic breast   . Hyperlipidemia   . Pneumonia   . Seizures ([place])    per pt had 1 seizure at age 52 and 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 maw  . Tobacco abuse    quit 2011   Past  Surgical History:  Procedure Laterality Date  . BREAST CYST EXCISION    . pneumonia    . TONSILLECTOMY    . TUBAL LIGATION     Social History   Tobacco Use  . Smoking status: Current Every Day Smoker    Packs/day: 0.50    Types: Cigarettes  . Smokeless tobacco: Current User  . Tobacco comment: E-Vap  Substance Use Topics  . Alcohol use: Yes    Alcohol/week: 0.0 oz    Comment: occ  . Drug use: No   Family History  Problem Relation Age of Onset  . Diabetes Mother   . Hypertension Mother   . Ovarian cysts Mother   . Hypertension Father   . Asthma Father   . Migraines Sister   . Cancer Maternal Grandmother        breast  . Cancer Paternal Grandfather        lung  . Cancer Cousin        melanoma  . Colon cancer Neg Hx   . Esophageal cancer Neg Hx   . Stomach cancer Neg Hx   . Rectal cancer Neg Hx    Allergies  Allergen Reactions  . Adhesive [Tape]  Per the pt , "when I were tape for a long time, days, it breaks out my skin". maw  . Neomycin-Bacitracin Zn-Polymyx     REACTION: rash  . Penicillins     REACTION: rash   Current Outpatient Medications on File Prior to Visit  Medication Sig Dispense Refill  . diphenhydrAMINE (BENADRYL) 25 MG tablet Take 25 mg by mouth at bedtime.     No current facility-administered medications on file prior to visit.     Review of Systems  Constitutional: Negative for activity change, appetite change, fatigue, fever and unexpected weight change.  HENT: Negative for congestion, ear pain, rhinorrhea, sinus pressure and sore throat.   Eyes: Negative for pain, redness and visual disturbance.  Respiratory: Negative for cough, shortness of breath and wheezing.   Cardiovascular: Negative for chest pain and palpitations.  Gastrointestinal: Negative for abdominal pain, blood in stool, constipation and diarrhea.  Endocrine: Negative for polydipsia and polyuria.  Genitourinary: Negative for dysuria, frequency and urgency.    Musculoskeletal: Negative for arthralgias, back pain and myalgias.  Skin: Negative for pallor and rash.  Allergic/Immunologic: Negative for environmental allergies.  Neurological: Negative for dizziness, syncope and headaches.  Hematological: Negative for adenopathy. Does not bruise/bleed easily.  Psychiatric/Behavioral: Negative for decreased concentration and dysphoric mood. The patient is not nervous/anxious.        Objective:   Physical Exam  Constitutional: She appears well-developed and well-nourished. No distress.  obese and well appearing   HENT:  Head: Normocephalic and atraumatic.  Right Ear: External ear normal.  Left Ear: External ear normal.  Mouth/Throat: Oropharynx is clear and moist.  Eyes: Conjunctivae and EOM are normal. Pupils are equal, round, and reactive to light. No scleral icterus.  Neck: Normal range of motion. Neck supple. No JVD present. Carotid bruit is not present. No thyromegaly present.  Cardiovascular: Normal rate, regular rhythm, normal heart sounds and intact distal pulses. Exam reveals no gallop.  Pulmonary/Chest: Effort normal and breath sounds normal. No respiratory distress. She has no wheezes. She exhibits no tenderness.  Diffusely distant bs   Abdominal: Soft. Bowel sounds are normal. She exhibits no distension, no abdominal bruit and no mass. There is no tenderness.  Genitourinary: No breast swelling, tenderness, discharge or bleeding.  Genitourinary Comments: Breast exam: No mass, nodules, thickening, tenderness, bulging, retraction, inflamation, nipple discharge or skin changes noted.  No axillary or clavicular LA.     Large pendulous breasts with dense tissue   Musculoskeletal: Normal range of motion. She exhibits no edema or tenderness.  Lymphadenopathy:    She has no cervical adenopathy.  Neurological: She is alert. She has normal reflexes. No cranial nerve deficit. She exhibits normal muscle tone. Coordination normal.  Skin: Skin is warm  and dry. No rash noted. No erythema. No pallor.  Solar lentigines diffusely    Psychiatric: She has a normal mood and affect.          Assessment & Plan:   Problem List Items Addressed This Visit      Other   Hyperlipidemia    Lipid panel today  Disc goals for lipids and reasons to control them Rev labs with pt (last time) Rev low sat fat diet in detail  Better diet  Atorvastatin       Relevant Medications   atorvastatin (LIPITOR) 10 MG tablet   Post-menopausal bleeding    No further bleeding Rev labs  F/u with gyn Tuesday as planned       Routine general medical  examination at a health care facility - Primary    Reviewed health habits including diet and exercise and skin cancer prevention Reviewed appropriate screening tests for age  Also reviewed health mt list, fam hx and immunization status , as well as social and family history   See HPI Labs ordered today  Enc further wt loss Almost ready to quit smoking-offered support  Ref for mammogram-overdue  Flu shot       Screening mammogram, encounter for    Scheduled annual screening mammogram Nl breast exam today  Encouraged monthly self exams        Relevant Orders   MM DIGITAL SCREENING BILATERAL   Smoker    Disc in detail risks of smoking and possible outcomes including copd, vascular/ heart disease, cancer , respiratory and sinus infections  Pt voices understanding Pt is almost ready to quit-plans to do cold [place]  Support offered

## 2018-01-23 NOTE — Assessment & Plan Note (Addendum)
Reviewed health habits including diet and exercise and skin cancer prevention Reviewed appropriate screening tests for age  Also reviewed health mt list, fam hx and immunization status , as well as social and family history   See HPI Labs ordered today  Enc further wt loss Almost ready to quit smoking-offered support  Ref for [employer]  Flu shot

## 2018-01-26 ENCOUNTER — Encounter: Payer: Self-pay | Admitting: *Deleted

## 2018-02-06 ENCOUNTER — Encounter: Payer: Self-pay | Admitting: Family Medicine

## 2018-02-06 ENCOUNTER — Ambulatory Visit: Payer: Self-pay | Admitting: *Deleted

## 2018-02-06 ENCOUNTER — Ambulatory Visit: Payer: BLUE CROSS/BLUE SHIELD | Admitting: Family Medicine

## 2018-02-06 VITALS — BP 136/90 | HR 68 | Temp 98.3°F | Ht 65.5 in | Wt 180.2 lb

## 2018-02-06 DIAGNOSIS — W19XXXD Unspecified fall, subsequent encounter: Secondary | ICD-10-CM

## 2018-02-06 DIAGNOSIS — S6992XD Unspecified injury of left wrist, hand and finger(s), subsequent encounter: Secondary | ICD-10-CM

## 2018-02-06 DIAGNOSIS — H8110 Benign paroxysmal vertigo, unspecified ear: Secondary | ICD-10-CM

## 2018-02-06 DIAGNOSIS — W19XXXA Unspecified fall, initial encounter: Secondary | ICD-10-CM | POA: Insufficient documentation

## 2018-02-06 MED ORDER — MECLIZINE HCL 25 MG PO TABS: 25.0000 mg | ORAL_TABLET | Freq: Three times a day (TID) | ORAL | 0 refills | Status: DC | PRN

## 2018-02-06 NOTE — Telephone Encounter (Signed)
Noted  

## 2018-02-06 NOTE — Telephone Encounter (Signed)
I returned her call.   She stated she fell 4 days ago (not due to dizziness/vertigo; did not have it then) after slamming her finger in the car door.   She was in a panic after it happened and was going through her garage and tripped on some stuff and fell spraining her ankle.   She was seen at Urgent Care that day.    She was experiencing the dizziness at the urgent care but was told it was from the pain.  She has continued to have "vertigo" for the last 4 days.   The room feels like it's spinning and moving her head makes it worse.  She has had this before a few years ago and Dr. Glori Bickers gave her a medication that helped.   This feels like the vertigo she was diagnosed with at that time.     I made her an appt with Dr. Diona Browner (she requested to see a female provider) for today at 4:00.   Her PCP Dr. Glori Bickers was not available today.    I instructed her to call us back or go to the ED if she passes out or gets worse.   She verbalized understanding and was agreeable to this plan. Reason for Disposition . [1] MODERATE dizziness (e.g., interferes with normal activities) AND [2] has NOT been evaluated by physician for this  (Exception: dizziness caused by heat exposure, sudden standing, or poor fluid intake)  Answer Assessment - Initial Assessment Questions 1. DESCRIPTION: "Describe your dizziness."     It seems like vertigo.    4 days ago I fell after slamming my finger in the car door.  I then panicked and tripped over some stuff in the garage and fell.   That's how I sprained my ankle.     2. LIGHTHEADED: "Do you feel lightheaded?" (e.g., somewhat faint, woozy, weak upon standing)     You have had vertigo before.  It feels like that.  It's been a few years. 3. VERTIGO: "Do you feel like either you or the room is spinning or tilting?" (i.e. vertigo)     The room is spinning.   Feels like I just got off of a ride at the fair. 4. SEVERITY: "How bad is it?"  "Do you feel like you are going to faint?" "Can  you stand and walk?"   - MILD - walking normally   - MODERATE - interferes with normal activities (e.g., work, school)    - SEVERE - unable to stand, requires support to walk, feels like passing out now.      It comes and goes.   Moving my head makes it worse. 5. ONSET:  "When did the dizziness begin?"     It started 4 days ago.   I did not have the "vertigo" when I fell.   It started after the fall. 6. AGGRAVATING FACTORS: "Does anything make it worse?" (e.g., standing, change in head position)     Moving my head makes it worse. 7. HEART RATE: "Can you tell me your heart rate?" "How many beats in 15 seconds?"  (Note: not all patients can do this)       *No Answer* 8. CAUSE: "What do you think is causing the dizziness?"     *No Answer* 9. RECURRENT SYMPTOM: "Have you had dizziness before?" If so, ask: "When was the last time?" "What happened that time?"     I had vertigo before many years ago.   Dr. Glori Bickers gave  a medication for it.   It helped.   Don't remember what the medication was. 10. OTHER SYMPTOMS: "Do you have any other symptoms?" (e.g., fever, chest pain, vomiting, diarrhea, bleeding)       No other symptoms.    Seen at Urgent care and was told the dizziness was from the pain 4 days ago when it happened but the "vertigo" is still going on. 11. PREGNANCY: "Is there any chance you are pregnant?" "When was your last menstrual period?"       No.  Tubes tied.  Protocols used: DIZZINESS [facility]

## 2018-02-06 NOTE — Telephone Encounter (Signed)
Pt has appt to see Dr Diona Browner 02/06/18 at 4 pm.

## 2018-02-06 NOTE — Patient Instructions (Signed)
Keep splint in pace on finger adjust for comfort.  Start home desensitization exercises.  Can use meclizine as needed for severe vertigo and nausea.  Follow up with PCP if not improving in 2 weeks.

## 2018-02-06 NOTE — Assessment & Plan Note (Signed)
Provided more comfortable splint for finger injury.  After placed, nml sensation in fingertip.

## 2018-02-06 NOTE — Assessment & Plan Note (Signed)
No sign of head injury, nml neuro exam.  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 Start meclizine prn and desensitization  exercises

## 2018-02-06 NOTE — Progress Notes (Signed)
Subjective:    Patient ID: Claudia Carter, female    DOB: 02-19-1966, 52 y.o.   MRN: [id number]  HPI    52 year old female pt Dr. Glori Bickers presents with new onset dizziness.  She stated she fell 4 days ago (not due to dizziness/vertigo; did not have it then) after slamming her finger in the car door.   She was in a panic after it happened and was going through her garage and tripped on some stuff and fell spraining her ankle.   She was seen at Urgent Care that day.    She was experiencing the dizziness at the urgent care but was told it was from the pain.   She is not sure if hit head. No cut or bruise.. Neck is sore from strain.  She has continued to have "vertigo" for the last 4 days.   The room feels like it's spinning and moving her head makes it worse.  She has had this before a few years ago and Dr. Glori Bickers gave her a medication that helped.   This feels like the vertigo she was diagnosed with at that time.     No severe headache, Mild nausea.  No vision change, no numbness, no weakness Pain is increased with moving head quickly. Balance off when walking.   Hx of BPPV:  Improved with meclizine.    Also has compression fracture of tip of left  2nd digit.. She reports splint was causeing numbness.. Removed.  Pain at tip, no current numbness, good ROM of DIP and PIP.  Blood pressure 136/90, pulse 68, temperature 98.3 F (36.8 C), temperature source Oral, height 5' 5.5" (1.664 m), weight 180 lb 4 oz (81.8 kg), SpO2 97 %.  Review of Systems  Constitutional: Negative for fatigue and fever.  HENT: Negative for ear pain.   Eyes: Negative for pain.  Respiratory: Negative for cough and shortness of breath.   Cardiovascular: Negative for chest pain.       Objective:   Physical Exam  Constitutional: She is oriented to person, place, and time. Vital signs are normal. She appears well-developed and well-nourished. She is cooperative.  Non-toxic appearance. She does not appear ill. No  distress.  HENT:  Head: Normocephalic.  Right Ear: Hearing, tympanic membrane, external ear and ear canal normal. Tympanic membrane is not erythematous, not retracted and not bulging.  Left Ear: Hearing, tympanic membrane, external ear and ear canal normal. Tympanic membrane is not erythematous, not retracted and not bulging.  Nose: No mucosal edema or rhinorrhea. Right sinus exhibits no maxillary sinus tenderness and no frontal sinus tenderness. Left sinus exhibits no maxillary sinus tenderness and no frontal sinus tenderness.  Mouth/Throat: Uvula is midline, oropharynx is clear and moist and mucous membranes are normal.  No scalp pain,no contusion or laceration  Unable to trigger vertigo with dix HAllpike  Eyes: Pupils are equal, round, and reactive to light. Conjunctivae, EOM and lids are normal. Lids are everted and swept, no foreign bodies found.  Neck: Trachea normal and normal range of motion. Neck supple. Carotid bruit is not present. No thyroid mass and no thyromegaly present.  Cardiovascular: Normal rate, regular rhythm, S1 normal, S2 normal, normal heart sounds, intact distal pulses and normal pulses. Exam reveals no gallop and no friction rub.  No murmur heard. Pulmonary/Chest: Effort normal and breath sounds normal. No tachypnea. No respiratory distress. She has no decreased breath sounds. She has no wheezes. She has no rhonchi. She has no rales.  Abdominal: Soft. Normal appearance and bowel sounds are normal. There is no tenderness.  Neurological: She is alert and oriented to person, place, and time. She has normal strength and normal reflexes. No cranial nerve deficit or sensory deficit. She exhibits normal muscle tone. She displays a negative Romberg sign. Coordination and gait normal. GCS eye subscore is 4. GCS verbal subscore is 5. GCS motor subscore is 6.  Nml cerebellar exam   No papilledema  Skin: Skin is warm, dry and intact. No rash noted.  Psychiatric: She has a normal  mood and affect. Her speech is normal and behavior is normal. Judgment and thought content normal. Her mood appears not anxious. Cognition and memory are normal. Cognition and memory are not impaired. She does not exhibit a depressed mood. She exhibits normal recent memory and normal remote memory.          Assessment & Plan:

## 2018-03-02 ENCOUNTER — Ambulatory Visit: Payer: BLUE CROSS/BLUE SHIELD | Admitting: Family Medicine

## 2018-03-02 ENCOUNTER — Encounter: Payer: Self-pay | Admitting: Family Medicine

## 2018-03-02 ENCOUNTER — Ambulatory Visit: Payer: Self-pay

## 2018-03-02 ENCOUNTER — Ambulatory Visit (INDEPENDENT_AMBULATORY_CARE_PROVIDER_SITE_OTHER)
Admission: RE | Admit: 2018-03-02 | Discharge: 2018-03-02 | Disposition: A | Discharge status: Home / Self Care | Payer: BLUE CROSS/BLUE SHIELD | Source: Ambulatory Visit | Attending: Family Medicine | Admitting: Family Medicine

## 2018-03-02 VITALS — BP 112/70 | HR 88 | Temp 98.6°F | Ht 65.5 in | Wt 177.8 lb

## 2018-03-02 DIAGNOSIS — F17200 Nicotine dependence, unspecified, uncomplicated: Secondary | ICD-10-CM

## 2018-03-02 DIAGNOSIS — J209 Acute bronchitis, unspecified: Secondary | ICD-10-CM

## 2018-03-02 DIAGNOSIS — R42 Dizziness and giddiness: Secondary | ICD-10-CM | POA: Diagnosis not present

## 2018-03-02 MED ORDER — PREDNISONE 10 MG PO TABS
ORAL_TABLET | ORAL | 0 refills | Status: DC
Start: 2018-03-02 — End: 2019-01-05

## 2018-03-02 MED ORDER — HYDROCODONE-HOMATROPINE 5-1.5 MG/5ML PO SYRP: 5.0000 mL | ORAL_SOLUTION | Freq: Three times a day (TID) | ORAL | 0 refills | Status: DC | PRN

## 2018-03-02 MED ORDER — VARENICLINE TARTRATE 1 MG PO TABS: 1.0000 mg | ORAL_TABLET | Freq: Two times a day (BID) | ORAL | 3 refills | Status: DC

## 2018-03-02 MED ORDER — BENZONATATE 200 MG PO CAPS: 200.0000 mg | ORAL_CAPSULE | Freq: Three times a day (TID) | ORAL | 1 refills | Status: DC | PRN

## 2018-03-02 MED ORDER — VARENICLINE TARTRATE 0.5 MG X 11 & 1 MG X 42 PO MISC: ORAL | 0 refills | Status: DC

## 2018-03-02 NOTE — Assessment & Plan Note (Signed)
Pt is ready to consider quitting Disc in detail risks of smoking and possible outcomes including copd, vascular/ heart disease, cancer , respiratory and sinus infections  Pt voices understanding Px chantix starter pack and continuing dose  Disc poss side eff incl mood  Update if any problems or issues  Support offered

## 2018-03-02 NOTE — Telephone Encounter (Signed)
Pt. Cough x 2 weeks. Has greenish-brown sputum with some wheezing. Denies fever. States she also has continued dizziness since March.Coughing has interrupted her sleep.Appointment made for today. Reason for Disposition . [1] Continuous (nonstop) coughing interferes with work or school AND [2] no improvement using cough treatment per Care Advice  Answer Assessment - Initial Assessment Questions 1. ONSET: "When did the cough begin?"      2 weeks ago 2. SEVERITY: "How bad is the cough today?"      Moderate 3. RESPIRATORY DISTRESS: "Describe your breathing."      Some shortness of breath 4. FEVER: "Do you have a fever?" If so, ask: "What is your temperature, how was it measured, and when did it start?"     No 5. SPUTUM: "Describe the color of your sputum" (clear, white, yellow, green)     Greenish-brown 6. HEMOPTYSIS: "Are you coughing up any blood?" If so ask: "How much?" (flecks, streaks, tablespoons, etc.)     No 7. CARDIAC HISTORY: "Do you have any history of heart disease?" (e.g., heart attack, congestive heart failure)      No 8. LUNG HISTORY: "Do you have any history of lung disease?"  (e.g., pulmonary embolus, asthma, emphysema)     No 9. PE RISK FACTORS: "Do you have a history of blood clots?" (or: recent major surgery, recent prolonged travel, bedridden )     No 10. OTHER SYMPTOMS: "Do you have any other symptoms?" (e.g., runny nose, wheezing, chest pain)       Wheezing 11. PREGNANCY: "Is there any chance you are pregnant?" "When was your last menstrual period?"       No 12. TRAVEL: "Have you traveled out of the country in the last month?" (e.g., travel history, exposures)       No  Protocols used: [place]

## 2018-03-02 NOTE — Telephone Encounter (Signed)
I will see her then  

## 2018-03-02 NOTE — Patient Instructions (Signed)
I think you have bronchitis   Drink fluids Rest Work on quitting smoking- when ready fill the starter pack first  If you have any intolerable side effects please let me know   Take the prednisone as directed Tessalon for cough  Hycodan for cough with caution of sedation   Chest xray today   We will refer you to ENT for the vertigo   Update if not starting to improve in a week or if worsening

## 2018-03-02 NOTE — Progress Notes (Signed)
Subjective:    Patient ID: Claudia Carter, female    DOB: 14-Mar-1966, 52 y.o.   MRN: [id number]  HPI Here with cough and dizziness   Cough started 2 weeks ago  No fever Chest was full- could hear rattling and wheezing  Took abx that she got when she broke her finger (clindamycin)  Has coughed so much her neck and back hurt   Green phlegm  Wheeze is still there   No nasal symptoms Throat and ears are ok  Some headache on and off- temples   Was seen by Dr Diona Browner 3/22 for vertigo   Temp: 98.6 F (37 C)   otc for cough  Had some px we gave her last year (hycodan)  Cough drops Hot liquid helps   Patient Active Problem List   Diagnosis Date Noted  . Acute bronchitis 03/02/2018  . Accidental fall 02/06/2018  . Finger injury, left, subsequent encounter 02/06/2018  . Post-menopausal bleeding 01/07/2018  . Screening mammogram, encounter for 12/20/2016  . Colon cancer screening 09/06/2015  . Vertigo 06/22/2014  . Benign paroxysmal positional vertigo 06/22/2014  . Encounter for routine gynecological examination 10/11/2013  . Routine general medical examination at a health care facility 11/26/2011  . Hyperlipidemia 12/08/2008  . Smoker 11/04/2007  . FIBROCYSTIC BREAST DISEASE 10/27/2007  . ECZEMA 10/27/2007   Past Medical History:  Diagnosis Date  . Allergy   . Anxiety   . Eczema   . Fibrocystic breast   . Hyperlipidemia   . Pneumonia   . Seizures ([place])    per pt had 1 seizure at age 20 and 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 maw  . Tobacco abuse    quit 2011   Past Surgical History:  Procedure Laterality Date  . BREAST CYST EXCISION    . pneumonia    . TONSILLECTOMY    . TUBAL LIGATION     Social History   Tobacco Use  . Smoking status: Current Every Day Smoker    Packs/day: 0.50    Types: Cigarettes  . Smokeless tobacco: Current User  . Tobacco comment: E-Vap  Substance Use Topics  . Alcohol use: Yes    Alcohol/week: 0.0 oz    Comment: occ  . Drug use: No    Family History  Problem Relation Age of Onset  . Diabetes Mother   . Hypertension Mother   . Ovarian cysts Mother   . Hypertension Father   . Asthma Father   . Migraines Sister   . Cancer Maternal Grandmother        breast  . Cancer Paternal Grandfather        lung  . Cancer Cousin        melanoma  . Colon cancer Neg Hx   . Esophageal cancer Neg Hx   . Stomach cancer Neg Hx   . Rectal cancer Neg Hx    Allergies  Allergen Reactions  . Adhesive [Tape]     Per the pt , "when I were tape for a long time, days, it breaks out my skin". maw  . Neomycin-Bacitracin Zn-Polymyx     REACTION: rash  . Penicillins     REACTION: rash   Current Outpatient Medications on File Prior to Visit  Medication Sig Dispense Refill  . atorvastatin (LIPITOR) 10 MG tablet Take 1 tablet (10 mg total) by mouth daily. 30 tablet 11  . diphenhydrAMINE (BENADRYL) 25 MG tablet Take 25 mg by mouth at bedtime.    . meclizine (ANTIVERT) 25  MG tablet Take 1 tablet (25 mg total) by mouth 3 (three) times daily as needed for dizziness. 30 tablet 0  . sertraline (ZOLOFT) 50 MG tablet Take 1 tablet (50 mg total) by mouth daily. 30 tablet 11   No current facility-administered medications on file prior to visit.     Review of Systems  Constitutional: Positive for appetite change and fatigue. Negative for fever.  HENT: Positive for congestion, postnasal drip, rhinorrhea and sore throat. Negative for ear pain, sinus pressure and sneezing.   Eyes: Negative for pain and discharge.  Respiratory: Positive for cough and wheezing. Negative for shortness of breath and stridor.   Cardiovascular: Negative for chest pain.  Gastrointestinal: Negative for diarrhea, nausea and vomiting.  Genitourinary: Negative for frequency, hematuria and urgency.  Musculoskeletal: Negative for arthralgias and myalgias.  Skin: Negative for rash.  Neurological: Positive for dizziness and headaches. Negative for weakness and light-headedness.   Psychiatric/Behavioral: Negative for confusion and dysphoric mood.       Objective:   Physical Exam  Constitutional: She appears well-developed and well-nourished. No distress.  HENT:  Head: Normocephalic and atraumatic.  Right Ear: External ear normal.  Left Ear: External ear normal.  Mouth/Throat: Oropharynx is clear and moist.  Nares are injected and congested  No sinus tenderness Clear rhinorrhea and post nasal drip   Eyes: Pupils are equal, round, and reactive to light. Conjunctivae and EOM are normal. Right eye exhibits no discharge. Left eye exhibits no discharge.  Horizontal nystagmus- 1 beat on L and 2 on R  Neck: Normal range of motion. Neck supple.  Cardiovascular: Normal rate and normal heart sounds.  Pulmonary/Chest: Effort normal and breath sounds normal. No respiratory distress. She has no wheezes. She has no rales. She exhibits no tenderness.  Harsh bs  Good air exch Wheeze on forced exp only  No rales  Few scattered rhonchi    Lymphadenopathy:    She has no cervical adenopathy.  Neurological: She is alert. She has normal reflexes. No cranial nerve deficit. She exhibits normal muscle tone. Coordination normal.  Skin: Skin is warm and dry. No rash noted. No pallor.  Psychiatric: She has a normal mood and affect.          Assessment & Plan:   Problem List Items Addressed This Visit      Respiratory   Acute bronchitis - Primary    Prednisone taper Tessalon and hycodan (caution of sedation) for cough  Disc symptomatic care - see instructions on AVS  Enc to quit smoking  cxr today  Update if not starting to improve in a week or if worsening         Relevant Orders   DG Chest 2 View (Completed)     Other   Smoker    Pt is ready to consider quitting Disc in detail risks of smoking and possible outcomes including copd, vascular/ heart disease, cancer , respiratory and sinus infections  Pt voices understanding Px chantix starter pack and continuing  dose  Disc poss side eff incl mood  Update if any problems or issues  Support offered       Vertigo    Not improving from last visit  Some nystagmus on exam  Ref to ENT Meclizine prn if helpful      Relevant Orders   Ambulatory referral to ENT

## 2018-03-02 NOTE — Assessment & Plan Note (Signed)
Prednisone taper Tessalon and hycodan (caution of sedation) for cough  Disc symptomatic care - see instructions on AVS  Enc to quit smoking  cxr today  Update if not starting to improve in a week or if worsening

## 2018-03-02 NOTE — Assessment & Plan Note (Signed)
Not improving from last visit  Some nystagmus on exam  Ref to ENT Meclizine prn if helpful

## 2018-03-16 ENCOUNTER — Ambulatory Visit: Payer: BLUE CROSS/BLUE SHIELD

## 2018-03-30 ENCOUNTER — Ambulatory Visit: Payer: Self-pay | Admitting: *Deleted

## 2018-03-30 NOTE — Telephone Encounter (Signed)
Called c/o having vomited once this morning but having several episodes of diarrhea since 5:00am.   She is drinking water and not having any other symptoms.   She asked if the Chantix she started last Wednesday was causing it.   I let her know it was possible but usually if the medication were going to upset her stomach it probably would have by now.  I went over the home care advice with her.  I let her know to call us back if she did not improve within 48 hours.  She verbalized understanding and was agreeable with this plan.      Reason for Disposition . MILD vomiting with diarrhea  Answer Assessment - Initial Assessment Questions 1. VOMITING SEVERITY: "How many times have you vomited in the past 24 hours?"     - MILD:  1 - 2 times/day    - MODERATE: 3 - 5 times/day, decreased oral intake without significant weight loss or symptoms of dehydration    - SEVERE: 6 or more times/day, vomits everything or nearly everything, with significant weight loss, symptoms of dehydration      Started at 5:00am this morning vomiting and diarrhea.   Vomited once but the diarrhea is multiple times.   I've not taken anything. 2. ONSET: "When did the vomiting begin?"      5:00am this morning.   I don't have a fever or body aches. 3. FLUIDS: "What fluids or food have you vomited up today?" "Have you been able to keep any fluids down?"     I haven't eaten anything but I'm sipping on water.    4. ABDOMINAL PAIN: "Are your having any abdominal pain?" If yes : "How bad is it and what does it feel like?" (e.g., crampy, dull, intermittent, constant)      A little abd cramping 5. DIARRHEA: "Is there any diarrhea?" If so, ask: "How many times today?"      Yes  See above 6. CONTACTS: "Is there anyone else in the family with the same symptoms?"      No 7. CAUSE: "What do you think is causing your vomiting?"     Chantix started last Wednesday.    8. HYDRATION STATUS: "Any signs of dehydration?" (e.g., dry mouth [not  only dry lips], too weak to stand) "When did you last urinate?"     No 9. OTHER SYMPTOMS: "Do you have any other symptoms?" (e.g., fever, headache, vertigo, vomiting blood or coffee grounds, recent head injury)     No fever or body aches.   I've had vertigo but not now. 10. PREGNANCY: "Is there any chance you are pregnant?" "When was your last menstrual period?"       No!  Protocols used: [facility]

## 2018-06-24 DIAGNOSIS — D485 Neoplasm of uncertain behavior of skin: Secondary | ICD-10-CM | POA: Diagnosis not present

## 2018-06-24 DIAGNOSIS — D1801 Hemangioma of skin and subcutaneous tissue: Secondary | ICD-10-CM | POA: Diagnosis not present

## 2018-06-24 DIAGNOSIS — D225 Melanocytic nevi of trunk: Secondary | ICD-10-CM | POA: Diagnosis not present

## 2018-06-24 DIAGNOSIS — L821 Other seborrheic keratosis: Secondary | ICD-10-CM | POA: Diagnosis not present

## 2019-01-05 ENCOUNTER — Encounter: Payer: Self-pay | Admitting: Family Medicine

## 2019-01-05 ENCOUNTER — Ambulatory Visit (INDEPENDENT_AMBULATORY_CARE_PROVIDER_SITE_OTHER): Payer: BLUE CROSS/BLUE SHIELD | Admitting: Family Medicine

## 2019-01-05 VITALS — BP 126/80 | HR 94 | Temp 98.6°F | Ht 65.5 in | Wt 182.4 lb

## 2019-01-05 DIAGNOSIS — R1030 Lower abdominal pain, unspecified: Secondary | ICD-10-CM

## 2019-01-05 DIAGNOSIS — R1032 Left lower quadrant pain: Secondary | ICD-10-CM

## 2019-01-05 DIAGNOSIS — K5792 Diverticulitis of intestine, part unspecified, without perforation or abscess without bleeding: Secondary | ICD-10-CM | POA: Insufficient documentation

## 2019-01-05 LAB — CBC WITH DIFFERENTIAL/PLATELET
Basophils Absolute: 0 10*3/uL (ref 0.0–0.1)
Basophils Relative: 0.2 % (ref 0.0–3.0)
EOS PCT: 1.4 % (ref 0.0–5.0)
Eosinophils Absolute: 0.1 10*3/uL (ref 0.0–0.7)
HCT: 42.6 % (ref 36.0–46.0)
Hemoglobin: 14.5 g/dL (ref 12.0–15.0)
Lymphocytes Relative: 13.3 % (ref 12.0–46.0)
Lymphs Abs: 1.3 10*3/uL (ref 0.7–4.0)
MCHC: 34.2 g/dL (ref 30.0–36.0)
MCV: 94.5 fl (ref 78.0–100.0)
MONOS PCT: 5.3 % (ref 3.0–12.0)
Monocytes Absolute: 0.5 10*3/uL (ref 0.1–1.0)
Neutro Abs: 8.1 10*3/uL — ABNORMAL HIGH (ref 1.4–7.7)
Neutrophils Relative %: 79.8 % — ABNORMAL HIGH (ref 43.0–77.0)
Platelets: 209 10*3/uL (ref 150.0–400.0)
RBC: 4.5 Mil/uL (ref 3.87–5.11)
RDW: 12.5 % (ref 11.5–15.5)
WBC: 10.1 10*3/uL (ref 4.0–10.5)

## 2019-01-05 LAB — POC URINALSYSI DIPSTICK (AUTOMATED)
BILIRUBIN UA: NEGATIVE
Blood, UA: NEGATIVE
GLUCOSE UA: NEGATIVE
KETONES UA: NEGATIVE
Leukocytes, UA: NEGATIVE
Nitrite, UA: NEGATIVE
Protein, UA: NEGATIVE
SPEC GRAV UA: 1.015 (ref 1.010–1.025)
Urobilinogen, UA: 0.2 E.U./dL
pH, UA: 6 (ref 5.0–8.0)

## 2019-01-05 LAB — COMPREHENSIVE METABOLIC PANEL
ALT: 12 U/L (ref 0–35)
AST: 13 U/L (ref 0–37)
Albumin: 4.6 g/dL (ref 3.5–5.2)
Alkaline Phosphatase: 81 U/L (ref 39–117)
BUN: 14 mg/dL (ref 6–23)
CO2: 28 mEq/L (ref 19–32)
Calcium: 9.8 mg/dL (ref 8.4–10.5)
Chloride: 100 mEq/L (ref 96–112)
Creatinine, Ser: 0.78 mg/dL (ref 0.40–1.20)
GFR: 77.07 mL/min (ref >60.00)
Glucose, Bld: 104 mg/dL — ABNORMAL HIGH (ref 70–99)
Potassium: 3.9 mEq/L (ref 3.5–5.1)
Sodium: 138 mEq/L (ref 135–145)
Total Bilirubin: 0.8 mg/dL (ref 0.2–1.2)
Total Protein: 7.6 g/dL (ref 6.0–8.3)

## 2019-01-05 MED ORDER — CIPROFLOXACIN HCL 500 MG PO TABS: 500.0000 mg | ORAL_TABLET | Freq: Two times a day (BID) | ORAL | 0 refills | Status: DC

## 2019-01-05 MED ORDER — METRONIDAZOLE 500 MG PO TABS: 500.0000 mg | ORAL_TABLET | Freq: Three times a day (TID) | ORAL | 0 refills | Status: DC

## 2019-01-05 NOTE — Assessment & Plan Note (Signed)
Strongly suspect diverticulitis given hx and exam  Cbc and cmet today  inst to follow clear liquid diet until feeling better cipro and flagyl prescribed  Analgesics prn  Update if not starting to improve in a week or if worsening  (low threshold to image with CT if no improvement or if worse) No signs of acute abd today

## 2019-01-05 NOTE — Progress Notes (Signed)
Subjective:    Patient ID: Claudia Carter, female    DOB: Feb 04, 1966, 53 y.o.   MRN: [id number]  HPI Here for symptoms of low abd pain and fever   Temp is 98.6 now  Wt Readings from Last 3 Encounters:  01/05/19 182 lb 6 oz (82.7 kg)  03/02/18 177 lb 12 oz (80.6 kg)  02/06/18 180 lb 4 oz (81.8 kg)   29.89 kg/m   2 nights ago  Felt like she had to have bm  Then had pain in lower abd and rectum  Hurting ever since   More pain in L side  Worsens to have BM - small but normal bm  No blood  No appetite  No n/v No diarrhea   Suspects fever at home  Body aches/chills/run down  Also always gets full body red rash with a fever   Has never had diverticulitis before   UA: Results for orders placed or performed in visit on 01/05/19  POCT Urinalysis Dipstick (Automated)  Result Value Ref Range   Color, UA Yellow    Clarity, UA Clear    Glucose, UA Negative Negative   Bilirubin, UA Negative    Ketones, UA Negative    Spec Grav, UA 1.015 1.010 - 1.025   Blood, UA Negative    pH, UA 6.0 5.0 - 8.0   Protein, UA Negative Negative   Urobilinogen, UA 0.2 0.2 or 1.0 E.U./dL   Nitrite, UA Negative    Leukocytes, UA Negative Negative     Last colonoscopy 2/17- had moderate sigmoid diverticulosis   Patient Active Problem List   Diagnosis Date Noted  . Left lower quadrant abdominal pain 01/05/2019  . Accidental fall 02/06/2018  . Finger injury, left, subsequent encounter 02/06/2018  . Post-menopausal bleeding 01/07/2018  . Screening mammogram, encounter for 12/20/2016  . Colon cancer screening 09/06/2015  . Vertigo 06/22/2014  . Benign paroxysmal positional vertigo 06/22/2014  . Encounter for routine gynecological examination 10/11/2013  . Routine general medical examination at a health care facility 11/26/2011  . Hyperlipidemia 12/08/2008  . Smoker 11/04/2007  . FIBROCYSTIC BREAST DISEASE 10/27/2007  . ECZEMA 10/27/2007   Past Medical History:  Diagnosis Date  .  Allergy   . Anxiety   . Eczema   . Fibrocystic breast   . Hyperlipidemia   . Pneumonia   . Seizures ([place])    per pt had 1 seizure at age 2 and 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 maw  . Tobacco abuse    quit 2011   Past Surgical History:  Procedure Laterality Date  . BREAST CYST EXCISION    . pneumonia    . TONSILLECTOMY    . TUBAL LIGATION     Social History   Tobacco Use  . Smoking status: Current Every Day Smoker    Packs/day: 0.50    Types: Cigarettes  . Smokeless tobacco: Current User  Substance Use Topics  . Alcohol use: Yes    Alcohol/week: 0.0 standard drinks    Comment: occ  . Drug use: No   Family History  Problem Relation Age of Onset  . Diabetes Mother   . Hypertension Mother   . Ovarian cysts Mother   . Hypertension Father   . Asthma Father   . Migraines Sister   . Cancer Maternal Grandmother        breast  . Cancer Paternal Grandfather        lung  . Cancer Cousin  melanoma  . Colon cancer Neg Hx   . Esophageal cancer Neg Hx   . Stomach cancer Neg Hx   . Rectal cancer Neg Hx    Allergies  Allergen Reactions  . Adhesive [Tape]     Per the pt , "when I were tape for a long time, days, it breaks out my skin". maw  . Neomycin-Bacitracin Zn-Polymyx     REACTION: rash  . Penicillins     REACTION: rash   Current Outpatient Medications on File Prior to Visit  Medication Sig Dispense Refill  . atorvastatin (LIPITOR) 10 MG tablet Take 1 tablet (10 mg total) by mouth daily. 30 tablet 11  . sertraline (ZOLOFT) 50 MG tablet Take 1 tablet (50 mg total) by mouth daily. 30 tablet 11   No current facility-administered medications on file prior to visit.     Review of Systems  Constitutional: Positive for fatigue and fever. Negative for activity change, appetite change and unexpected weight change.  HENT: Negative for congestion, ear pain, rhinorrhea, sinus pressure and sore throat.   Eyes: Negative for pain, redness and visual disturbance.  Respiratory:  Negative for cough, shortness of breath and wheezing.   Cardiovascular: Negative for chest pain and palpitations.  Gastrointestinal: Positive for abdominal pain. Negative for abdominal distention, anal bleeding, blood in stool, constipation, diarrhea, nausea, rectal pain and vomiting.  Endocrine: Negative for polydipsia and polyuria.  Genitourinary: Negative for dysuria, frequency and urgency.  Musculoskeletal: Negative for arthralgias, back pain and myalgias.  Skin: Negative for pallor and rash.  Allergic/Immunologic: Negative for environmental allergies.  Neurological: Negative for dizziness, syncope and headaches.  Hematological: Negative for adenopathy. Does not bruise/bleed easily.  Psychiatric/Behavioral: Negative for decreased concentration and dysphoric mood. The patient is not nervous/anxious.        Objective:   Physical Exam Constitutional:      General: She is not in acute distress.    Appearance: She is well-developed. She is obese. She is not ill-appearing.     Comments: Not ill appearing but fatigued   HENT:     Head: Normocephalic and atraumatic.  Eyes:     General: No scleral icterus.    Extraocular Movements: Extraocular movements intact.     Conjunctiva/sclera: Conjunctivae normal.     Pupils: Pupils are equal, round, and reactive to light.  Neck:     Musculoskeletal: Normal range of motion and neck supple.  Cardiovascular:     Rate and Rhythm: Normal rate and regular rhythm.     Heart sounds: Normal heart sounds.  Pulmonary:     Effort: Pulmonary effort is normal. No respiratory distress.     Breath sounds: Normal breath sounds. No wheezing or rales.     Comments: Diffusely distant bs  Abdominal:     General: Abdomen is flat. Bowel sounds are normal. There is no distension or abdominal bruit.     Palpations: Abdomen is soft. There is no hepatomegaly or mass.     Tenderness: There is abdominal tenderness in the right lower quadrant and left lower quadrant.  There is no right CVA tenderness, left CVA tenderness, guarding or rebound. Negative signs include Murphy's sign, McBurney's sign and psoas sign.     Comments: LLQ tenderness is pronounced  Mild RLQ tenderness No guarding or rebound  No pain on movement or shaking table  Lymphadenopathy:     Cervical: No cervical adenopathy.  Skin:    General: Skin is warm and dry.     Capillary Refill:  Capillary refill takes less than 2 seconds.     Coloration: Skin is not jaundiced or pale.     Findings: No erythema.     Comments: Macular diffuse red rash on trunk  Neurological:     Mental Status: She is alert.  Psychiatric:        Mood and Affect: Mood normal.           Assessment & Plan:   Problem List Items Addressed This Visit      Other   Left lower quadrant abdominal pain - Primary    Strongly suspect diverticulitis given hx and exam  Cbc and cmet today  inst to follow clear liquid diet until feeling better cipro and flagyl prescribed  Analgesics prn  Update if not starting to improve in a week or if worsening  (low threshold to image with CT if no improvement or if worse) No signs of acute abd today      Relevant Orders   CBC with Differential/Platelet (Completed)   Comprehensive metabolic panel (Completed)    Other Visit Diagnoses    Lower abdominal pain       Relevant Orders   POCT Urinalysis Dipstick (Automated) (Completed)

## 2019-01-05 NOTE — Patient Instructions (Addendum)
Start checking temperature at home   I think you may have diverticulitis  Stick with clear fluids for the next several days  Tylenol if needed for fever   Take the cipro and flagyl as directed   Labs today   If worse- call us or go to ER  We may want to get a CT scan if worse or not improving   Update if not starting to improve in a week or if worsening

## 2019-01-14 ENCOUNTER — Telehealth: Payer: Self-pay

## 2019-01-14 NOTE — Telephone Encounter (Signed)
Glad she is overall feeling better and agree with advisement to stop the antibiotic.  How is she feeling today? (leg and abdomen wise) I want her to f/u if not better

## 2019-01-14 NOTE — Telephone Encounter (Signed)
Patient calls in to report that she saw Dr. Glori Bickers recently for diverticulitis and was placed on an antibiotic.  Overall she is feeling better.  Her BM's are not quite back to normal but is improving.  However, today she was leaving a restaurant after eating lunch and walking quickly out to her car due to the cold, and legs went completely weak and almost buckled and she nearly fell. She has felt dizzy off and on while on medication. But this "total loss of motor control" for a second was scary and is new.    Her legs have felt weaker and has had general muscle aches since episode.  She is wondering if this was possibly a side effect of her medication?    She finishes one medication today and one tomorrow am.    Discussed with Dr. Damita Dunnings who instructs patient to hold both medications tonight until we discuss with Dr. Glori Bickers tomorrow.  Also patient is to be sure she is increasing fluid intake.  LM on patient's cell ( she gave me permission during initial call) as she will be driving and cannot talk.  Details left with above instructions.  Forwarding to Dr. Glori Bickers for further instructions tomorrow.

## 2019-01-15 NOTE — Telephone Encounter (Signed)
Left VM requesting pt to call the office back 

## 2019-01-15 NOTE — Telephone Encounter (Signed)
Pt notified of Dr. Marliss Coots instructions and verbalized understanding. F/u appt scheduled this Monday

## 2019-01-15 NOTE — Telephone Encounter (Signed)
Pt said she is still having GI issues she said when she eats everything "runs through her" but it's not painful like it was before. Pt said that she still feels like her coordination is still off when she walks its wobbly and feels like her knee is going to buckle/give way any moment and she isn't sure why. Please advise

## 2019-01-15 NOTE — Telephone Encounter (Signed)
Keep hydrated this weekend and follow up next week for a re check.  If symptoms worsen at all -seek care over the weekend  Let me know if any muscle pain or joint swelling as well

## 2019-01-18 ENCOUNTER — Encounter: Payer: Self-pay | Admitting: Family Medicine

## 2019-01-18 ENCOUNTER — Ambulatory Visit (INDEPENDENT_AMBULATORY_CARE_PROVIDER_SITE_OTHER): Payer: BLUE CROSS/BLUE SHIELD | Admitting: Family Medicine

## 2019-01-18 VITALS — BP 124/86 | HR 55 | Temp 98.2°F | Ht 65.5 in | Wt 180.4 lb

## 2019-01-18 DIAGNOSIS — R42 Dizziness and giddiness: Secondary | ICD-10-CM | POA: Insufficient documentation

## 2019-01-18 DIAGNOSIS — R400 Somnolence: Secondary | ICD-10-CM | POA: Diagnosis not present

## 2019-01-18 DIAGNOSIS — R0683 Snoring: Secondary | ICD-10-CM

## 2019-01-18 DIAGNOSIS — B372 Candidiasis of skin and nail: Secondary | ICD-10-CM | POA: Insufficient documentation

## 2019-01-18 DIAGNOSIS — R197 Diarrhea, unspecified: Secondary | ICD-10-CM

## 2019-01-18 DIAGNOSIS — K5792 Diverticulitis of intestine, part unspecified, without perforation or abscess without bleeding: Secondary | ICD-10-CM | POA: Diagnosis not present

## 2019-01-18 MED ORDER — TERCONAZOLE 0.8 % VA CREA: TOPICAL_CREAM | VAGINAL | 0 refills | Status: DC

## 2019-01-18 NOTE — Assessment & Plan Note (Signed)
With witnessed apnea and loud snoring  Will ref to pulmonary/sleep clinic for evaluation  Disc implications of sleep apnea and tx options

## 2019-01-18 NOTE — Assessment & Plan Note (Signed)
Diverticulitis treated and resolved with cipro/flagyl -now persistent diarrhea may be abx related

## 2019-01-18 NOTE — Progress Notes (Signed)
Subjective:    Patient ID: Claudia Carter, female    DOB: Aug 05, 1966, 53 y.o.   MRN: [id number]  HPI  Here for re check of abdominal pain   tx with cipro and flagyl at last visit for her suspected diverticulitis   Wt Readings from Last 3 Encounters:  01/18/19 180 lb 7 oz (81.8 kg)  01/05/19 182 lb 6 oz (82.7 kg)  03/02/18 177 lb 12 oz (80.6 kg)   29.57 kg/m   She had suspected side effect of medication-towards the end of tx had dizzy and weak spell coming out of a restaurant  Also muscle aches   Then diarrhea -still struggling with that  Trying to keep hydrated  Every time she eats she has to run to the bathroom  Loose stool-not watery   Some shooting pains down her R leg when cleaning once in a while No longer weak but just unsteady   Very tired  Husband says she holds breath at night  ? Sleep apnea  Is a loud snorer   Rash -in perineum  Super itchy  Cold compress    Dizzy and feeling off kilter (was having a bit before she was treated) H/o intermittent vertigo in the past    Patient Active Problem List   Diagnosis Date Noted  . Somnolence 01/18/2019  . Diarrhea 01/18/2019  . Snoring 01/18/2019  . Yeast dermatitis 01/18/2019  . Dizziness 01/18/2019  . Diverticulitis 01/05/2019  . Accidental fall 02/06/2018  . Finger injury, left, subsequent encounter 02/06/2018  . Post-menopausal bleeding 01/07/2018  . Screening mammogram, encounter for 12/20/2016  . Colon cancer screening 09/06/2015  . Vertigo 06/22/2014  . Benign paroxysmal positional vertigo 06/22/2014  . Encounter for routine gynecological examination 10/11/2013  . Routine general medical examination at a health care facility 11/26/2011  . Hyperlipidemia 12/08/2008  . Smoker 11/04/2007  . FIBROCYSTIC BREAST DISEASE 10/27/2007  . ECZEMA 10/27/2007   Past Medical History:  Diagnosis Date  . Allergy   . Anxiety   . Eczema   . Fibrocystic breast   . Hyperlipidemia   . Pneumonia   . Seizures  ([place])    per pt had 1 seizure at age 42 and 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 maw  . Tobacco abuse    quit 2011   Past Surgical History:  Procedure Laterality Date  . BREAST CYST EXCISION    . pneumonia    . TONSILLECTOMY    . TUBAL LIGATION     Social History   Tobacco Use  . Smoking status: Current Every Day Smoker    Packs/day: 0.50    Types: Cigarettes  . Smokeless tobacco: Current User  Substance Use Topics  . Alcohol use: Yes    Alcohol/week: 0.0 standard drinks    Comment: occ  . Drug use: No   Family History  Problem Relation Age of Onset  . Diabetes Mother   . Hypertension Mother   . Ovarian cysts Mother   . Hypertension Father   . Asthma Father   . Migraines Sister   . Cancer Maternal Grandmother        breast  . Cancer Paternal Grandfather        lung  . Cancer Cousin        melanoma  . Colon cancer Neg Hx   . Esophageal cancer Neg Hx   . Stomach cancer Neg Hx   . Rectal cancer Neg Hx    Allergies  Allergen Reactions  . Adhesive [Tape]  Per the pt , "when I were tape for a long time, days, it breaks out my skin". maw  . Neomycin-Bacitracin Zn-Polymyx     REACTION: rash  . Penicillins     REACTION: rash   Current Outpatient Medications on File Prior to Visit  Medication Sig Dispense Refill  . atorvastatin (LIPITOR) 10 MG tablet Take 1 tablet (10 mg total) by mouth daily. 30 tablet 11  . sertraline (ZOLOFT) 50 MG tablet Take 1 tablet (50 mg total) by mouth daily. 30 tablet 11   No current facility-administered medications on file prior to visit.       Review of Systems  Constitutional: Positive for fatigue. Negative for activity change, appetite change, chills, fever and unexpected weight change.  HENT: Negative for congestion, ear pain, rhinorrhea, sinus pressure and sore throat.   Eyes: Negative for pain, redness and visual disturbance.  Respiratory: Positive for apnea. Negative for cough, shortness of breath and wheezing.   Cardiovascular:  Negative for chest pain, palpitations and leg swelling.  Gastrointestinal: Positive for diarrhea. Negative for abdominal distention, abdominal pain, anal bleeding, blood in stool, constipation, nausea, rectal pain and vomiting.  Endocrine: Negative for polydipsia and polyuria.  Genitourinary: Negative for decreased urine volume, dysuria, frequency and urgency.  Musculoskeletal: Negative for arthralgias, back pain and myalgias.  Skin: Negative for pallor and rash.  Allergic/Immunologic: Negative for environmental allergies.  Neurological: Positive for dizziness. Negative for tremors, seizures, syncope, facial asymmetry, speech difficulty, numbness and headaches.  Hematological: Negative for adenopathy. Does not bruise/bleed easily.  Psychiatric/Behavioral: Negative for decreased concentration and dysphoric mood. The patient is not nervous/anxious.        Objective:   Physical Exam Constitutional:      General: She is not in acute distress.    Appearance: Normal appearance. She is obese. She is not ill-appearing or diaphoretic.     Comments: Appears fatigued   HENT:     Head: Normocephalic and atraumatic.     Right Ear: Tympanic membrane and ear canal normal.     Left Ear: Tympanic membrane and ear canal normal.     Nose: No congestion.     Mouth/Throat:     Mouth: Mucous membranes are dry.     Pharynx: Oropharynx is clear. No posterior oropharyngeal erythema.     Comments: Mucous membranes are slightly dry Eyes:     General: No scleral icterus.    Extraocular Movements: Extraocular movements intact.     Conjunctiva/sclera: Conjunctivae normal.     Pupils: Pupils are equal, round, and reactive to light.     Comments: 1-2 beats of horizontal nystagmus bilaterally  Neck:     Musculoskeletal: Normal range of motion. No neck rigidity or muscular tenderness.  Cardiovascular:     Rate and Rhythm: Regular rhythm. Bradycardia present.     Pulses: Normal pulses.  Pulmonary:     Effort:  Pulmonary effort is normal. No respiratory distress.     Breath sounds: Normal breath sounds. No wheezing or rales.  Abdominal:     General: Abdomen is flat. Bowel sounds are normal. There is no distension.     Palpations: Abdomen is soft. There is no mass.     Tenderness: There is no abdominal tenderness. There is no guarding or rebound.  Musculoskeletal:     Right lower leg: No edema.     Left lower leg: No edema.     Comments: No leg swelling or tenderness   Lymphadenopathy:     Cervical:  No cervical adenopathy.  Skin:    General: Skin is warm and dry.     Capillary Refill: Capillary refill takes less than 2 seconds.     Coloration: Skin is not jaundiced or pale.     Findings: Rash present. No erythema.     Comments: Erythema in buttock cleft and skin around anus  Neurological:     General: No focal deficit present.     Mental Status: She is alert.     Cranial Nerves: No cranial nerve deficit.     Sensory: No sensory deficit.     Motor: No weakness or tremor.     Coordination: Romberg sign negative. Coordination normal.     Deep Tendon Reflexes: Reflexes normal.  Psychiatric:        Mood and Affect: Mood normal.           Assessment & Plan:   Problem List Items Addressed This Visit      Musculoskeletal and Integument   Yeast dermatitis    On perineal area s/p abx tx  Px terazol cream for use QD inst to keep area clean and dry  Update if not starting to improve in a week or if worsening          Other   Diverticulitis    Diverticulitis treated and resolved with cipro/flagyl -now persistent diarrhea may be abx related       Somnolence    With witnessed apnea and loud snoring  Will ref to pulmonary/sleep clinic for evaluation  Disc implications of sleep apnea and tx options      Relevant Orders   Ambulatory referral to Pulmonology   Diarrhea - Primary    Ongoing since tx of diverticulitis with cipro and flagyl  Disc probiotic tx-will get Align otc and  also eat yogurt Stool testing for C diff ordered  Disc imp of hydration as well-needs to inc her fluids  inst to update if symptoms worsen      Relevant Orders   C. difficile GDH and Toxin A/B   Snoring    Ref to pulm for sleep eval      Relevant Orders   Ambulatory referral to Pulmonology   Dizziness    Worse since diverticulitis tx in pt with vertigo  ? If side eff of abx Also suspect some mild dehydration from current diarrhea  Reassuring exam today  Enc her to begin hydrating more consistently and follow closely  Update if not starting to improve in a week or if worsening

## 2019-01-18 NOTE — Assessment & Plan Note (Signed)
Ongoing since tx of diverticulitis with cipro and flagyl  Disc probiotic tx-will get Align otc and also eat yogurt Stool testing for C diff ordered  Disc imp of hydration as well-needs to inc her fluids  inst to update if symptoms worsen

## 2019-01-18 NOTE — Assessment & Plan Note (Signed)
Worse since diverticulitis tx in pt with vertigo  ? If side eff of abx Also suspect some mild dehydration from current diarrhea  Reassuring exam today  Enc her to begin hydrating more consistently and follow closely  Update if not starting to improve in a week or if worsening

## 2019-01-18 NOTE — Assessment & Plan Note (Signed)
Ref to pulm for sleep eval

## 2019-01-18 NOTE — Assessment & Plan Note (Signed)
On perineal area s/p abx tx  Px terazol cream for use QD inst to keep area clean and dry  Update if not starting to improve in a week or if worsening

## 2019-01-18 NOTE — Patient Instructions (Addendum)
Our office will call you re: a sleep eval referral with pulmonary   Get a probiotic over the counter- Align or generic of it  Take it daily for a month or as directed Also yogurt if you like it   Hydrate more - aim for 64 oz of fluid a day / mostly water   c diff test of stool- ordered today   Let us know if symptoms do not start to improve (rash/dizziness/diarrhea)   Use terazol cream for yeast rash

## 2019-01-19 LAB — C. DIFFICILE GDH AND TOXIN A/B
GDH ANTIGEN: NOT DETECTED
MICRO NUMBER:: 263573
SPECIMEN QUALITY:: ADEQUATE
TOXIN A AND B: NOT DETECTED

## 2019-01-22 ENCOUNTER — Telehealth: Payer: Self-pay

## 2019-01-22 MED ORDER — HYDROCORTISONE 1 % RE CREA: 1.0000 "application " | TOPICAL_CREAM | Freq: Two times a day (BID) | RECTAL | 0 refills | Status: DC

## 2019-01-22 NOTE — Telephone Encounter (Signed)
I'm sending a generic for proctocort to use topically  Let me know next week how that works Keep area as clean and dry as possible as well

## 2019-01-22 NOTE — Telephone Encounter (Signed)
Left detailed message on voicemail.  

## 2019-01-22 NOTE — Telephone Encounter (Signed)
Pt left v/m that pt was seen 01/18/19 and was given terconazole cream for yeast infection due to taking recent abx. Pt is extremely uncomfortable; pt said the perineal itching is driving pt insane; pt said terconazole cream helps for about 1 hour and pt can only use once a day. Pt wants to know if there is any other med to use for the perineal itching. No discharge and no vaginal itching. Pt request cb today. [facility].

## 2019-01-26 ENCOUNTER — Other Ambulatory Visit: Payer: Self-pay | Admitting: Family Medicine

## 2019-01-26 NOTE — Telephone Encounter (Signed)
Please schedule a PE and refill until then  

## 2019-01-26 NOTE — Telephone Encounter (Signed)
Pt had a recent acute appt but no lipid labs, they haven't been checked in over a year, also no future appts., please advise

## 2019-01-27 NOTE — Telephone Encounter (Signed)
Med refilled once and Carrie will reach out to pt to try and get CPE scheduled  

## 2019-02-01 ENCOUNTER — Other Ambulatory Visit: Payer: Self-pay | Admitting: Family Medicine

## 2019-02-01 ENCOUNTER — Other Ambulatory Visit: Payer: BLUE CROSS/BLUE SHIELD

## 2019-02-01 ENCOUNTER — Telehealth: Payer: Self-pay | Admitting: Family Medicine

## 2019-02-01 DIAGNOSIS — Z0000 Encounter for general adult medical examination without abnormal findings: Secondary | ICD-10-CM

## 2019-02-01 DIAGNOSIS — E7800 Pure hypercholesterolemia, unspecified: Secondary | ICD-10-CM

## 2019-02-01 NOTE — Telephone Encounter (Signed)
-----   Message from Ellamae Sia sent at 02/01/2019  8:43 AM EDT ----- Regarding: lab orders for Monday, 3.16.20 Patient is scheduled for CPX labs, please order future labs, Thanks , Karna Christmas

## 2019-02-10 ENCOUNTER — Encounter: Payer: BLUE CROSS/BLUE SHIELD | Admitting: Family Medicine

## 2019-02-19 ENCOUNTER — Other Ambulatory Visit: Payer: Self-pay | Admitting: Family Medicine

## 2019-02-24 ENCOUNTER — Other Ambulatory Visit: Payer: Self-pay | Admitting: Family Medicine

## 2019-04-08 ENCOUNTER — Ambulatory Visit (INDEPENDENT_AMBULATORY_CARE_PROVIDER_SITE_OTHER): Payer: BLUE CROSS/BLUE SHIELD | Admitting: Internal Medicine

## 2019-04-08 ENCOUNTER — Other Ambulatory Visit: Payer: Self-pay

## 2019-04-08 ENCOUNTER — Encounter: Payer: Self-pay | Admitting: Internal Medicine

## 2019-04-08 VITALS — BP 110/88 | HR 98 | Temp 98.2°F | Ht 65.0 in | Wt 183.4 lb

## 2019-04-08 DIAGNOSIS — F17200 Nicotine dependence, unspecified, uncomplicated: Secondary | ICD-10-CM | POA: Diagnosis not present

## 2019-04-08 DIAGNOSIS — R0683 Snoring: Secondary | ICD-10-CM

## 2019-04-08 DIAGNOSIS — G4733 Obstructive sleep apnea (adult) (pediatric): Secondary | ICD-10-CM

## 2019-04-08 NOTE — Assessment & Plan Note (Signed)
Importance os smoking cessation emphasized. Support available

## 2019-04-08 NOTE — Patient Instructions (Signed)
Order- please schedule sleep study- HST or NPSG/ [facility]  For dx OSA  Please call as needed

## 2019-04-08 NOTE — Progress Notes (Signed)
04/08/2019- 53 yoF married current smoker for sleep evaluation referred by Dr. Glori Bickers (PCP) for somnolence and snoring. 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 She has been told for years that she snores loudly, with witnessed apneas. Feels unrested during the day with no opportunity to nap.  For sleep takes melatonin and a probiotic. Coffee 2 cups in AM.  ENT+tonsillectomy. Denies heart or lung problems. No parasomnias. WASO at least once.  Father + OSA CXR 03/02/18- IMPRESSION: No active cardiopulmonary disease.  Prior to Admission medications   Medication Sig Start Date End Date Taking? Authorizing Provider  atorvastatin (LIPITOR) 10 MG tablet TAKE 1 TABLET BY MOUTH EVERY DAY 02/24/19  Yes Tower, Wynelle Fanny, MD  hydrocortisone (PROCTOCORT) 1 % CREA Apply 1 application topically 2 (two) times daily. 01/22/19  Yes Tower, Wynelle Fanny, MD  sertraline (ZOLOFT) 50 MG tablet TAKE 1 TABLET BY MOUTH EVERY DAY 02/19/19  Yes Tower, Wynelle Fanny, MD  terconazole (TERAZOL 3) 0.8 % vaginal cream Apply to affected yeast rash externally once daily as needed Patient not taking: Reported on 04/08/2019 01/18/19   Abner Greenspan, MD   Past Medical History:  Diagnosis Date  . Allergy   . Anxiety   . Eczema   . Fibrocystic breast   . Hyperlipidemia   . Pneumonia   . Seizures ([place])    per pt had 1 seizure at age 52 and 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 maw  . Tobacco abuse    quit 2011   Past Surgical History:  Procedure Laterality Date  . BREAST CYST EXCISION    . pneumonia    . TONSILLECTOMY    . TUBAL LIGATION     Family History  Problem Relation Age of Onset  . Diabetes Mother   . Hypertension Mother   . Ovarian cysts Mother   . Hypertension Father   . Asthma Father   . Cancer Cousin        melanoma  . Migraines Sister   . Cancer Maternal Grandmother        breast  . Cancer Paternal Grandfather         lung  . Colon cancer Neg Hx   . Esophageal cancer Neg Hx   . Stomach cancer Neg Hx   . Rectal cancer Neg Hx    Social History   Socioeconomic History  . Marital status: Married    Spouse name: Not on file  . Number of children: 2  . Years of education: Not on file  . Highest education level: Not on file  Occupational History  . Occupation: [employer]  Social Needs  . Financial resource strain: Not on file  . Food insecurity:    Worry: Not on file    Inability: Not on file  . Transportation needs:    Medical: Not on file    Non-medical: Not on file  Tobacco Use  . Smoking status: Current Every Day Smoker    Packs/day: 1.00    Types: Cigarettes  . Smokeless tobacco: Current User  . Tobacco comment: As of 04/08/2019, smokes 1 pack/day  Substance and Sexual Activity  . Alcohol use: Yes    Alcohol/week: 0.0 standard drinks    Comment: occ  . Drug use: No  . Sexual activity: Not on file  Lifestyle  . Physical activity:    Days per week: Not on file  Minutes per session: Not on file  . Stress: Not on file  Relationships  . Social connections:    Talks on phone: Not on file    Gets together: Not on file    Attends religious service: Not on file    Active member of club or organization: Not on file    Attends meetings of clubs or organizations: Not on file    Relationship status: Not on file  . Intimate partner violence:    Fear of current or ex partner: Not on file    Emotionally abused: Not on file    Physically abused: Not on file    Forced sexual activity: Not on file  Other Topics Concern  . Not on file  Social History Narrative  . Not on file   ROS-see HPI   + = positive Constitutional:    weight loss, night sweats, fevers, chills, fatigue, lassitude. HEENT:    headaches, difficulty swallowing, +tooth/dental problems, sore throat,       sneezing, itching, ear ache, nasal congestion, post nasal drip, snoring CV:    chest pain, orthopnea, PND, swelling in  lower extremities, anasarca,                                  dizziness, palpitations Resp:   shortness of breath with exertion or at rest.                productive cough,   non-productive cough, coughing up of blood.              change in color of mucus.  wheezing.   Skin:    rash or lesions. GI:  No-   heartburn, indigestion, abdominal pain, nausea, vomiting, diarrhea,                 change in bowel habits, loss of appetite GU: dysuria, change in color of urine, no urgency or frequency.   flank pain. MS:   joint pain, stiffness, decreased range of motion, back pain. Neuro-     nothing unusual Psych:  change in mood or affect.  depression or anxiety.   memory loss.  OBJ- Physical Exam General- Alert, Oriented, Affect-appropriate, Distress- none acute Skin- rash-none, lesions- none, excoriation- none Lymphadenopathy- none Head- atraumatic            Eyes- Gross vision intact, PERRLA, conjunctivae and secretions clear            Ears- Hearing, canals-normal            Nose- Clear, no-Septal dev, mucus, polyps, erosion, perforation             Throat- Mallampati III , mucosa clear , drainage- none, tonsils absent, + teeth Neck- flexible , trachea midline, no stridor , thyroid nl, carotid no bruit Chest - symmetrical excursion , unlabored           Heart/CV- RRR , no murmur , no gallop  , no rub, nl s1 s2                           - JVD- none , edema- none, stasis changes- none, varices- none           Lung- clear to P&A, wheeze- none, cough- none , dullness-none, rub- none           Chest wall-  Abd-  Br/  Gen/ Rectal- Not done, not indicated Extrem- cyanosis- none, clubbing, none, atrophy- none, strength- nl Neuro- grossly intact to observation

## 2019-04-08 NOTE — Assessment & Plan Note (Signed)
History and physical compatible with OSA. Basic sleep hygiene, OSA, testing, medical concerns, treatment, responsibility for safe driving.  Plan- schedule sleep study

## 2019-04-16 ENCOUNTER — Ambulatory Visit (HOSPITAL_BASED_OUTPATIENT_CLINIC_OR_DEPARTMENT_OTHER): Payer: BC Managed Care – PPO | Attending: Internal Medicine | Admitting: Internal Medicine

## 2019-04-16 ENCOUNTER — Other Ambulatory Visit: Payer: Self-pay

## 2019-04-16 DIAGNOSIS — G4733 Obstructive sleep apnea (adult) (pediatric): Secondary | ICD-10-CM | POA: Insufficient documentation

## 2019-04-19 ENCOUNTER — Other Ambulatory Visit: Payer: Self-pay

## 2019-04-24 DIAGNOSIS — G4733 Obstructive sleep apnea (adult) (pediatric): Secondary | ICD-10-CM

## 2019-04-24 NOTE — Procedures (Signed)
   Patient Name: Claudia Carter, Wire Date: 04/16/2019 Gender: Female D.O.B: Jan 20, 1966 Age (years): 25 Referring Provider: Baird Lyons MD, ABSM Height (inches): 65 Interpreting Physician: Baird Lyons MD, ABSM Weight (lbs): 183 RPSGT: Earney [place] BMI: 30 MRN: [id number] Neck Size: 14.00  CLINICAL INFORMATION Sleep Study Type: NPSG Indication for sleep study: Insomnia with OSA Epworth Sleepiness Score: 4  SLEEP STUDY TECHNIQUE As per the AASM Manual for the Scoring of Sleep and Associated Events v2.3 (April 2016) with a hypopnea requiring 4% desaturations.  The channels recorded and monitored were frontal, central and occipital EEG, electrooculogram (EOG), submentalis EMG (chin), nasal and oral airflow, thoracic and abdominal wall motion, anterior tibialis EMG, snore microphone, electrocardiogram, and pulse oximetry.  MEDICATIONS Medications self-administered by patient taken the night of the study : ATORVASTATIN, SERTRALINE, ALIGN  SLEEP ARCHITECTURE The study was initiated at 10:05:33 PM and ended at 4:26:03 AM.  Sleep onset time was 84.8 minutes and the sleep efficiency was 72.0%%. The total sleep time was 274 minutes.  Stage REM latency was 227.0 minutes.  The patient spent 1.8%% of the night in stage N1 sleep, 89.4%% in stage N2 sleep, 0.0%% in stage N3 and 8.8% in REM.  Alpha intrusion was absent.  Supine sleep was 34.91%.  RESPIRATORY PARAMETERS The overall apnea/hypopnea index (AHI) was 5.0 per hour. There were 0 total apneas, including 0 obstructive, 0 central and 0 mixed apneas. There were 23 hypopneas and 33 RERAs.  The AHI during Stage REM sleep was 30.0 per hour.  AHI while supine was 13.8 per hour.  The mean oxygen saturation was 93.7%. The minimum SpO2 during sleep was 87.0%.  moderate snoring was noted during this study.  CARDIAC DATA The 2 lead EKG demonstrated sinus rhythm. The mean heart rate was 71.3 beats per minute. Other EKG findings  include: None.  LEG MOVEMENT DATA The total PLMS were 0 with a resulting PLMS index of 0.0. Associated arousal with leg movement index was 1.1 .  IMPRESSIONS - Mild obstructive sleep apnea occurred during this study (AHI = 5.0/h). - No significant central sleep apnea occurred during this study (CAI = 0.0/h). - Mild oxygen desaturation was noted during this study (Min O2 = 87.0%). Mean sat 93%. - The patient snored with moderate snoring volume. - No cardiac abnormalities were noted during this study. - Clinically significant periodic limb movements did not occur during sleep. No significant associated arousals.  DIAGNOSIS - Obstructive Sleep Apnea (327.23 [G47.33 ICD-10])  RECOMMENDATIONS - Treatment for very mild OSA is directed at symptoms. Conservative measures may include observation, weight loss and sleep position off back. - Other options such as CPAP or a fitted oral appliance, would be basedd on clinical judgment. - Be careful with alcohol, sedatives and other CNS depressants that may worsen sleep apnea and disrupt normal sleep architecture. - Sleep hygiene should be reviewed to assess factors that may improve sleep quality. - Weight management and regular exercise should be initiated or continued if appropriate.  [Electronically signed] 04/24/2019 12:51 PM  Baird Lyons MD, [place], American Board of Sleep Medicine   NPI: [id number]                          [place]  ELECTRONICALLY SIGNED ON:  04/24/2019, 12:46 PM [place] PH: [phone]   FX: [phone] [place]

## 2019-05-03 ENCOUNTER — Other Ambulatory Visit: Payer: Self-pay | Admitting: Family Medicine

## 2019-05-03 NOTE — Telephone Encounter (Signed)
Should have one more refill on file, will hold until her CPE on 05/05/19

## 2019-05-05 ENCOUNTER — Other Ambulatory Visit: Payer: Self-pay

## 2019-05-05 ENCOUNTER — Encounter: Payer: Self-pay | Admitting: Family Medicine

## 2019-05-05 ENCOUNTER — Ambulatory Visit (INDEPENDENT_AMBULATORY_CARE_PROVIDER_SITE_OTHER): Payer: BC Managed Care – PPO | Admitting: Family Medicine

## 2019-05-05 VITALS — BP 130/76 | HR 75 | Temp 97.6°F | Ht 65.5 in | Wt 182.4 lb

## 2019-05-05 DIAGNOSIS — Z0000 Encounter for general adult medical examination without abnormal findings: Secondary | ICD-10-CM

## 2019-05-05 DIAGNOSIS — R0683 Snoring: Secondary | ICD-10-CM

## 2019-05-05 DIAGNOSIS — E7800 Pure hypercholesterolemia, unspecified: Secondary | ICD-10-CM | POA: Diagnosis not present

## 2019-05-05 DIAGNOSIS — Z1231 Encounter for screening mammogram for malignant neoplasm of breast: Secondary | ICD-10-CM

## 2019-05-05 DIAGNOSIS — F17200 Nicotine dependence, unspecified, uncomplicated: Secondary | ICD-10-CM

## 2019-05-05 NOTE — Assessment & Plan Note (Signed)
Pending result of sleep study- will likely need cpap  She is anxious to start this

## 2019-05-05 NOTE — Progress Notes (Signed)
Subjective:    Patient ID: Claudia Carter, female    DOB: 11/20/1965, 53 y.o.   MRN: [id number]  HPI  Here for health maintenance exam and to review chronic medical problems   Wt Readings from Last 3 Encounters:  05/05/19 182 lb 6 oz (82.7 kg)  04/16/19 183 lb (83 kg)  04/08/19 183 lb 6.4 oz (83.2 kg)  wt is stable  Is eating fairly  Exercise - joined [employer] (it closed) -but doing on line   29.89 kg/m   Working from home  She misses being out in the world   Smoking status - still smoking / more that she is home  approx 1 ppd  Not ready to quit yet   Stress level is high  Noticed some swelling in neck - not sore  No h/o thyroid issues Sister has thyroid problems   She went for her sleep study - trouble sleeping and sleep apnea  Waiting for results and plan  Will likely need cpap   Mammogram -has not gone /overdue  Self breast exam-no lumps   Not interested in HIV screening/low risk  Flu shot 3/19   Pap 2/18 neg with neg HPV screen  Had an episode of post menopausal bleeding - had endo biopsy and it was ok    Tdap 1/13  Colonoscopy 2/17 -51 y recall for hyperplastic polyps   Stools are better now  Taking probiotic  No diverticulitis flares recently    Patient Active Problem List   Diagnosis Date Noted  . Somnolence 01/18/2019  . Snoring 01/18/2019  . Screening mammogram, encounter for 12/20/2016  . Colon cancer screening 09/06/2015  . Encounter for routine gynecological examination 10/11/2013  . Routine general medical examination at a health care facility 11/26/2011  . Hyperlipidemia 12/08/2008  . Smoker 11/04/2007  . FIBROCYSTIC BREAST DISEASE 10/27/2007  . ECZEMA 10/27/2007   Past Medical History:  Diagnosis Date  . Allergy   . Anxiety   . Eczema   . Fibrocystic breast   . Hyperlipidemia   . Pneumonia   . Seizures ([place])    per pt had 1 seizure at age 49 and 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 maw  . Tobacco abuse    quit 2011   Past Surgical  History:  Procedure Laterality Date  . BREAST CYST EXCISION    . pneumonia    . TONSILLECTOMY    . TUBAL LIGATION     Social History   Tobacco Use  . Smoking status: Current Every Day Smoker    Packs/day: 1.00    Types: Cigarettes  . Smokeless tobacco: Current User  . Tobacco comment: As of 04/08/2019, smokes 1 pack/day  Substance Use Topics  . Alcohol use: Yes    Alcohol/week: 0.0 standard drinks    Comment: occ  . Drug use: No   Family History  Problem Relation Age of Onset  . Diabetes Mother   . Hypertension Mother   . Ovarian cysts Mother   . Hypertension Father   . Asthma Father   . Cancer Cousin        melanoma  . Migraines Sister   . Cancer Maternal Grandmother        breast  . Cancer Paternal Grandfather        lung  . Colon cancer Neg Hx   . Esophageal cancer Neg Hx   . Stomach cancer Neg Hx   . Rectal cancer Neg Hx    Allergies  Allergen Reactions  .  Adhesive [Tape]     Per the pt , "when I were tape for a long time, days, it breaks out my skin". maw  . Neomycin-Bacitracin Zn-Polymyx     REACTION: rash  . Penicillins     REACTION: rash   Current Outpatient Medications on File Prior to Visit  Medication Sig Dispense Refill  . atorvastatin (LIPITOR) 10 MG tablet TAKE 1 TABLET BY MOUTH EVERY DAY 30 tablet 2  . sertraline (ZOLOFT) 50 MG tablet TAKE 1 TABLET BY MOUTH EVERY DAY 90 tablet 0   No current facility-administered medications on file prior to visit.     Review of Systems  Constitutional: Positive for fatigue. Negative for activity change, appetite change, fever and unexpected weight change.  HENT: Negative for congestion, ear pain, rhinorrhea, sinus pressure and sore throat.   Eyes: Negative for pain, redness and visual disturbance.  Respiratory: Negative for cough, shortness of breath and wheezing.   Cardiovascular: Negative for chest pain and palpitations.  Gastrointestinal: Negative for abdominal pain, blood in stool, constipation and  diarrhea.  Endocrine: Negative for polydipsia and polyuria.  Genitourinary: Negative for dysuria, frequency and urgency.  Musculoskeletal: Negative for arthralgias, back pain and myalgias.  Skin: Negative for pallor and rash.  Allergic/Immunologic: Negative for environmental allergies.  Neurological: Negative for dizziness, syncope and headaches.  Hematological: Negative for adenopathy. Does not bruise/bleed easily.  Psychiatric/Behavioral: Negative for decreased concentration and dysphoric mood. The patient is not nervous/anxious.        Stressors        Objective:   Physical Exam Constitutional:      General: She is not in acute distress.    Appearance: Normal appearance. She is well-developed. She is not ill-appearing.     Comments: Overweight   HENT:     Head: Normocephalic and atraumatic.     Right Ear: Tympanic membrane, ear canal and external ear normal.     Left Ear: Tympanic membrane, ear canal and external ear normal.     Nose: Nose normal. No congestion.     Mouth/Throat:     Mouth: Mucous membranes are moist.     Pharynx: Oropharynx is clear. No posterior oropharyngeal erythema.  Eyes:     General: No scleral icterus.    Conjunctiva/sclera: Conjunctivae normal.     Pupils: Pupils are equal, round, and reactive to light.  Neck:     Musculoskeletal: Normal range of motion and neck supple.     Thyroid: No thyromegaly.     Vascular: No carotid bruit or JVD.  Cardiovascular:     Rate and Rhythm: Normal rate and regular rhythm.     Pulses: Normal pulses.     Heart sounds: Normal heart sounds. No gallop.   Pulmonary:     Effort: Pulmonary effort is normal. No respiratory distress.     Breath sounds: Normal breath sounds. No wheezing.  Chest:     Chest wall: No tenderness.  Abdominal:     General: Bowel sounds are normal. There is no distension or abdominal bruit.     Palpations: Abdomen is soft. There is no mass.     Tenderness: There is no abdominal tenderness.      Hernia: No hernia is present.  Genitourinary:    Comments: Breast exam: No mass, nodules, thickening, tenderness, bulging, retraction, inflamation, nipple discharge or skin changes noted.  No axillary or clavicular LA.     Musculoskeletal: Normal range of motion.        General: No  tenderness.     Right lower leg: No edema.     Left lower leg: No edema.  Lymphadenopathy:     Cervical: No cervical adenopathy.  Skin:    General: Skin is warm and dry.     Coloration: Skin is not pale.     Findings: No erythema or rash.     Comments: Healing ant bites on L foot   Solar lentigines diffusely   Neurological:     General: No focal deficit present.     Mental Status: She is alert.     Cranial Nerves: No cranial nerve deficit.     Motor: No abnormal muscle tone.     Coordination: Coordination normal.     Gait: Gait normal.     Deep Tendon Reflexes: Reflexes are normal and symmetric. Reflexes normal.  Psychiatric:        Mood and Affect: Mood normal.           Assessment & Plan:   Problem List Items Addressed This Visit      Other   Hyperlipidemia    Lipid panel today  Disc goals for lipids and reasons to control them Rev last labs with pt Rev low sat fat diet in detail  Continues atorvastatin and diet       Smoker    Disc in detail risks of smoking and possible outcomes including copd, vascular/ heart disease, cancer , respiratory and sinus infections  Pt voices understanding She is not ready to quit-stress level was high      Routine general medical examination at a health care facility - Primary    Reviewed health habits including diet and exercise and skin cancer prevention Reviewed appropriate screening tests for age  Also reviewed health mt list, fam hx and immunization status , as well as social and family history   Labs ordered See HPI Enc smoking cessation  Enc regular exercise  Referral done for mammogram - [facility] will call pt to schedule  utd gyn care  utd  imms  May consider shingrix        Relevant Orders   CBC with Differential/Platelet   Comprehensive metabolic panel   Lipid panel   TSH   Screening mammogram, encounter for    Scheduled annual screening mammogram Nl breast exam today  Encouraged monthly self exams        Relevant Orders   MM 3D SCREEN BREAST BILATERAL   Snoring    Pending result of sleep study- will likely need cpap  She is anxious to start this

## 2019-05-05 NOTE — Assessment & Plan Note (Signed)
Lipid panel today  Disc goals for lipids and reasons to control them Rev last labs with pt Rev low sat fat diet in detail  Continues atorvastatin and diet

## 2019-05-05 NOTE — Assessment & Plan Note (Signed)
Disc in detail risks of smoking and possible outcomes including copd, vascular/ heart disease, cancer , respiratory and sinus infections  Pt voices understanding She is not ready to quit-stress level was high

## 2019-05-05 NOTE — Assessment & Plan Note (Signed)
Scheduled annual screening mammogram Nl breast exam today  Encouraged monthly self exams   

## 2019-05-05 NOTE — Patient Instructions (Signed)
We will call you to schedule a mammogram   Labs today   Try to eat a healthy balanced diet and exercise regularly  Good luck with the cpap treatment   Continue your probiotic

## 2019-05-05 NOTE — Assessment & Plan Note (Signed)
Reviewed health habits including diet and exercise and skin cancer prevention Reviewed appropriate screening tests for age  Also reviewed health mt list, fam hx and immunization status , as well as social and family history   Labs ordered See HPI Enc smoking cessation  Enc regular exercise  Referral done for mammogram - [facility] will call pt to schedule  utd gyn care  utd imms  May consider shingrix

## 2019-05-06 LAB — CBC WITH DIFFERENTIAL/PLATELET
Basophils Absolute: 0 10*3/uL (ref 0.0–0.1)
Basophils Relative: 0.2 % (ref 0.0–3.0)
Eosinophils Absolute: 0.3 10*3/uL (ref 0.0–0.7)
Eosinophils Relative: 3.7 % (ref 0.0–5.0)
HCT: 42.4 % (ref 36.0–46.0)
Hemoglobin: 14.5 g/dL (ref 12.0–15.0)
Lymphocytes Relative: 33.8 % (ref 12.0–46.0)
Lymphs Abs: 2.4 10*3/uL (ref 0.7–4.0)
MCHC: 34.2 g/dL (ref 30.0–36.0)
MCV: 95.5 fl (ref 78.0–100.0)
Monocytes Absolute: 0.4 10*3/uL (ref 0.1–1.0)
Monocytes Relative: 5.3 % (ref 3.0–12.0)
Neutro Abs: 4.1 10*3/uL (ref 1.4–7.7)
Neutrophils Relative %: 57 % (ref 43.0–77.0)
Platelets: 226 10*3/uL (ref 150.0–400.0)
RBC: 4.44 Mil/uL (ref 3.87–5.11)
RDW: 12.9 % (ref 11.5–15.5)
WBC: 7.2 10*3/uL (ref 4.0–10.5)

## 2019-05-06 LAB — COMPREHENSIVE METABOLIC PANEL
ALT: 20 U/L (ref 0–35)
AST: 17 U/L (ref 0–37)
Albumin: 4.6 g/dL (ref 3.5–5.2)
Alkaline Phosphatase: 83 U/L (ref 39–117)
BUN: 21 mg/dL (ref 6–23)
CO2: 25 mEq/L (ref 19–32)
Calcium: 9.2 mg/dL (ref 8.4–10.5)
Chloride: 103 mEq/L (ref 96–112)
Creatinine, Ser: 0.78 mg/dL (ref 0.40–1.20)
GFR: 76.97 mL/min (ref >60.00)
Glucose, Bld: 86 mg/dL (ref 70–99)
Potassium: 3.9 mEq/L (ref 3.5–5.1)
Sodium: 138 mEq/L (ref 135–145)
Total Bilirubin: 0.4 mg/dL (ref 0.2–1.2)
Total Protein: 7 g/dL (ref 6.0–8.3)

## 2019-05-06 LAB — LIPID PANEL
Cholesterol: 228 mg/dL — ABNORMAL HIGH (ref 0–200)
HDL: 46 mg/dL (ref >39.00)
Total CHOL/HDL Ratio: 5
Triglycerides: 594 mg/dL — ABNORMAL HIGH (ref 0.0–149.0)

## 2019-05-06 LAB — LDL CHOLESTEROL, DIRECT: Direct LDL: 109 mg/dL

## 2019-05-06 LAB — TSH: TSH: 1.21 u[IU]/mL (ref 0.35–4.50)

## 2019-05-11 ENCOUNTER — Telehealth: Payer: Self-pay | Admitting: *Deleted

## 2019-05-11 MED ORDER — ATORVASTATIN CALCIUM 20 MG PO TABS: 20.0000 mg | ORAL_TABLET | Freq: Every day | ORAL | 3 refills | Status: DC

## 2019-05-11 NOTE — Telephone Encounter (Signed)
-----   Message from Abner Greenspan, MD sent at 05/07/2019  9:31 PM EDT ----- Cholesterol is up significantly  Please increase atorvastatin to 20 mg daily (she takes 10) Send in refills for year Re check fasting lipids in 6 weeks please  Avoid red meat/ fried foods/ egg yolks/ fatty breakfast meats/ butter, cheese and high fat dairy/ and shellfish

## 2019-05-11 NOTE — Telephone Encounter (Signed)
Pt notified of lab results and Dr. Marliss Coots comments, Rx sent to pharmacy and lab appt scheduled

## 2019-05-30 ENCOUNTER — Other Ambulatory Visit: Payer: Self-pay | Admitting: Family Medicine

## 2019-06-23 ENCOUNTER — Telehealth: Payer: Self-pay

## 2019-06-23 NOTE — Telephone Encounter (Signed)
Left detailed VM w COVID screen and back door lab info   

## 2019-06-24 ENCOUNTER — Ambulatory Visit
Admission: RE | Admit: 2019-06-24 | Discharge: 2019-06-24 | Disposition: A | Discharge status: Home / Self Care | Payer: BC Managed Care – PPO | Source: Ambulatory Visit | Attending: Family Medicine | Admitting: Family Medicine

## 2019-06-24 ENCOUNTER — Other Ambulatory Visit: Payer: Self-pay

## 2019-06-24 DIAGNOSIS — Z1231 Encounter for screening mammogram for malignant neoplasm of breast: Secondary | ICD-10-CM | POA: Diagnosis not present

## 2019-06-25 ENCOUNTER — Other Ambulatory Visit (INDEPENDENT_AMBULATORY_CARE_PROVIDER_SITE_OTHER): Payer: BC Managed Care – PPO

## 2019-06-25 DIAGNOSIS — Z0000 Encounter for general adult medical examination without abnormal findings: Secondary | ICD-10-CM

## 2019-06-25 LAB — CBC WITH DIFFERENTIAL/PLATELET
Basophils Absolute: 0 10*3/uL (ref 0.0–0.1)
Basophils Relative: 0.8 % (ref 0.0–3.0)
Eosinophils Absolute: 0.3 10*3/uL (ref 0.0–0.7)
Eosinophils Relative: 6.1 % — ABNORMAL HIGH (ref 0.0–5.0)
HCT: 45.5 % (ref 36.0–46.0)
Hemoglobin: 15.3 g/dL — ABNORMAL HIGH (ref 12.0–15.0)
Lymphocytes Relative: 32.5 % (ref 12.0–46.0)
Lymphs Abs: 1.8 10*3/uL (ref 0.7–4.0)
MCHC: 33.6 g/dL (ref 30.0–36.0)
MCV: 97.8 fl (ref 78.0–100.0)
Monocytes Absolute: 0.4 10*3/uL (ref 0.1–1.0)
Monocytes Relative: 7 % (ref 3.0–12.0)
Neutro Abs: 3 10*3/uL (ref 1.4–7.7)
Neutrophils Relative %: 53.6 % (ref 43.0–77.0)
Platelets: 217 10*3/uL (ref 150.0–400.0)
RBC: 4.65 Mil/uL (ref 3.87–5.11)
RDW: 12.7 % (ref 11.5–15.5)
WBC: 5.5 10*3/uL (ref 4.0–10.5)

## 2019-06-25 LAB — LDL CHOLESTEROL, DIRECT: Direct LDL: 99 mg/dL

## 2019-06-25 LAB — LIPID PANEL
Cholesterol: 166 mg/dL (ref 0–200)
HDL: 44.9 mg/dL (ref >39.00)
NonHDL: 121.41
Total CHOL/HDL Ratio: 4
Triglycerides: 201 mg/dL — ABNORMAL HIGH (ref 0.0–149.0)
VLDL: 40.2 mg/dL — ABNORMAL HIGH (ref 0.0–40.0)

## 2019-06-25 LAB — COMPREHENSIVE METABOLIC PANEL
ALT: 28 U/L (ref 0–35)
AST: 19 U/L (ref 0–37)
Albumin: 4.9 g/dL (ref 3.5–5.2)
Alkaline Phosphatase: 77 U/L (ref 39–117)
BUN: 18 mg/dL (ref 6–23)
CO2: 28 mEq/L (ref 19–32)
Calcium: 10 mg/dL (ref 8.4–10.5)
Chloride: 101 mEq/L (ref 96–112)
Creatinine, Ser: 0.92 mg/dL (ref 0.40–1.20)
GFR: 63.59 mL/min (ref >60.00)
Glucose, Bld: 110 mg/dL — ABNORMAL HIGH (ref 70–99)
Potassium: 4.2 mEq/L (ref 3.5–5.1)
Sodium: 139 mEq/L (ref 135–145)
Total Bilirubin: 0.8 mg/dL (ref 0.2–1.2)
Total Protein: 7.6 g/dL (ref 6.0–8.3)

## 2019-06-25 LAB — TSH: TSH: 1.58 u[IU]/mL (ref 0.35–4.50)

## 2019-06-28 ENCOUNTER — Other Ambulatory Visit: Payer: Self-pay | Admitting: Family Medicine

## 2019-06-28 DIAGNOSIS — R928 Other abnormal and inconclusive findings on diagnostic imaging of breast: Secondary | ICD-10-CM

## 2019-07-02 ENCOUNTER — Other Ambulatory Visit: Payer: Self-pay | Admitting: Family Medicine

## 2019-07-02 ENCOUNTER — Other Ambulatory Visit: Payer: Self-pay

## 2019-07-02 ENCOUNTER — Ambulatory Visit
Admission: RE | Admit: 2019-07-02 | Discharge: 2019-07-02 | Disposition: A | Discharge status: Home / Self Care | Payer: BC Managed Care – PPO | Source: Ambulatory Visit | Attending: Family Medicine | Admitting: Family Medicine

## 2019-07-02 DIAGNOSIS — R922 Inconclusive mammogram: Secondary | ICD-10-CM | POA: Diagnosis not present

## 2019-07-02 DIAGNOSIS — R928 Other abnormal and inconclusive findings on diagnostic imaging of breast: Secondary | ICD-10-CM

## 2019-07-02 DIAGNOSIS — R921 Mammographic calcification found on diagnostic imaging of breast: Secondary | ICD-10-CM | POA: Diagnosis not present

## 2019-07-05 ENCOUNTER — Ambulatory Visit
Admission: RE | Admit: 2019-07-05 | Discharge: 2019-07-05 | Disposition: A | Discharge status: Home / Self Care | Payer: BC Managed Care – PPO | Source: Ambulatory Visit | Attending: Family Medicine | Admitting: Family Medicine

## 2019-07-05 ENCOUNTER — Other Ambulatory Visit: Payer: Self-pay

## 2019-07-05 DIAGNOSIS — N6321 Unspecified lump in the left breast, upper outer quadrant: Secondary | ICD-10-CM | POA: Diagnosis not present

## 2019-07-05 DIAGNOSIS — N649 Disorder of breast, unspecified: Secondary | ICD-10-CM | POA: Diagnosis not present

## 2019-07-05 DIAGNOSIS — R921 Mammographic calcification found on diagnostic imaging of breast: Secondary | ICD-10-CM | POA: Diagnosis not present

## 2019-07-05 DIAGNOSIS — R928 Other abnormal and inconclusive findings on diagnostic imaging of breast: Secondary | ICD-10-CM

## 2019-07-06 ENCOUNTER — Other Ambulatory Visit: Payer: Self-pay | Admitting: Family Medicine

## 2019-07-06 DIAGNOSIS — R921 Mammographic calcification found on diagnostic imaging of breast: Secondary | ICD-10-CM

## 2019-07-08 ENCOUNTER — Ambulatory Visit
Admission: RE | Admit: 2019-07-08 | Discharge: 2019-07-08 | Disposition: A | Discharge status: Home / Self Care | Payer: BC Managed Care – PPO | Source: Ambulatory Visit | Attending: Family Medicine | Admitting: Family Medicine

## 2019-07-08 ENCOUNTER — Other Ambulatory Visit: Payer: Self-pay

## 2019-07-08 DIAGNOSIS — N6321 Unspecified lump in the left breast, upper outer quadrant: Secondary | ICD-10-CM | POA: Diagnosis not present

## 2019-07-08 DIAGNOSIS — R921 Mammographic calcification found on diagnostic imaging of breast: Secondary | ICD-10-CM

## 2019-07-08 DIAGNOSIS — N6012 Diffuse cystic mastopathy of left breast: Secondary | ICD-10-CM | POA: Diagnosis not present

## 2019-08-19 ENCOUNTER — Ambulatory Visit: Payer: BLUE CROSS/BLUE SHIELD | Admitting: Internal Medicine

## 2019-09-27 ENCOUNTER — Other Ambulatory Visit: Payer: Self-pay | Admitting: *Deleted

## 2019-09-27 DIAGNOSIS — Z20822 Contact with and (suspected) exposure to covid-19: Secondary | ICD-10-CM

## 2019-09-28 LAB — NOVEL CORONAVIRUS, NAA: SARS-CoV-2, NAA: NOT DETECTED

## 2019-10-13 ENCOUNTER — Other Ambulatory Visit: Payer: Self-pay

## 2019-10-13 DIAGNOSIS — Z20822 Contact with and (suspected) exposure to covid-19: Secondary | ICD-10-CM

## 2019-10-14 LAB — NOVEL CORONAVIRUS, NAA: SARS-CoV-2, NAA: NOT DETECTED

## 2019-12-16 ENCOUNTER — Other Ambulatory Visit: Payer: Self-pay | Admitting: Family Medicine

## 2019-12-22 ENCOUNTER — Ambulatory Visit (INDEPENDENT_AMBULATORY_CARE_PROVIDER_SITE_OTHER): Payer: BC Managed Care – PPO | Admitting: Family Medicine

## 2019-12-22 ENCOUNTER — Other Ambulatory Visit: Payer: Self-pay

## 2019-12-22 ENCOUNTER — Encounter: Payer: Self-pay | Admitting: Family Medicine

## 2019-12-22 DIAGNOSIS — F17200 Nicotine dependence, unspecified, uncomplicated: Secondary | ICD-10-CM | POA: Diagnosis not present

## 2019-12-22 DIAGNOSIS — J209 Acute bronchitis, unspecified: Secondary | ICD-10-CM

## 2019-12-22 MED ORDER — PREDNISONE 10 MG PO TABS: ORAL_TABLET | ORAL | 0 refills | Status: DC

## 2019-12-22 MED ORDER — HYDROCODONE-HOMATROPINE 5-1.5 MG/5ML PO SYRP: 5.0000 mL | ORAL_SOLUTION | Freq: Three times a day (TID) | ORAL | 0 refills | Status: DC | PRN

## 2019-12-22 MED ORDER — ALBUTEROL SULFATE HFA 108 (90 BASE) MCG/ACT IN AERS: 2.0000 | INHALATION_SPRAY | RESPIRATORY_TRACT | 0 refills | Status: DC | PRN

## 2019-12-22 MED ORDER — DOXYCYCLINE HYCLATE 100 MG PO TABS: 100.0000 mg | ORAL_TABLET | Freq: Two times a day (BID) | ORAL | 0 refills | Status: DC

## 2019-12-22 NOTE — Assessment & Plan Note (Signed)
Negative for covid Smoker  Some wheezing/some sinus pain  Px prednisone and albuterol mdi (side eff disc) Also hycodan for pm cough- warned of sedation Doxycycline due to length of illness (over 2 wk)  Update if not starting to improve in a week or if worsening   Also if any new symptoms

## 2019-12-22 NOTE — Patient Instructions (Signed)
Drink fluids and rest  Use albuterol inhaler and take prednisone for wheezing/tight chest  Hycodan for cough with caution of sedation  Doxycycline for possible sinus infection /bacterial bugs   Update if not starting to improve in a week or if worsening   If new symptoms let us know   Please consider quitting smoking

## 2019-12-22 NOTE — Assessment & Plan Note (Signed)
Disc in detail risks of smoking and possible outcomes including copd, vascular/ heart disease, cancer , respiratory and sinus infections  Pt voices understanding Pt does not feel ready to quit

## 2019-12-22 NOTE — Progress Notes (Signed)
Virtual Visit via Video Note  I connected with Claudia Carter on 12/22/19 at  9:30 AM EST by a video enabled telemedicine application and verified that I am speaking with the correct person using two identifiers.  Location: Patient: home Provider: office   I discussed the limitations of evaluation and management by telemedicine and the availability of in person appointments. The patient expressed understanding and agreed to proceed.  Parties involved in encounter  Patient: Claudia Carter  Provider:  Loura Pardon MD    History of Present Illness: Pt presents with cough and congestion   2 weeks ago started getting ST and aches and cold symptoms /congested  [facility] -[facility]   Never lost her taste or smell   She is a smoker - still smoking 1ppd    Now a lot of pressure in her head  Runny/stuffy nose and cough  Yellow mucous  Cough-prod -yellow/brown  A little wheezing   Had some facial pain on the R side  Some body aches  Felt feverish but no fever   Otc:  nyquil Day time cold and flu - multi symptom  Not helping a lot - feels spacy  She is trying to work at home   Does not have an inhaler to use   Cough is worse at night   Husband had a virus around [place] -but not as bad  (he does work outside the home)  No covid contacts known  Works at home  [employer]    Patient Active Problem List   Diagnosis Date Noted  . Somnolence 01/18/2019  . Snoring 01/18/2019  . Acute bronchitis 03/02/2018  . Screening mammogram, encounter for 12/20/2016  . Colon cancer screening 09/06/2015  . Encounter for routine gynecological examination 10/11/2013  . Routine general medical examination at a health care facility 11/26/2011  . Hyperlipidemia 12/08/2008  . Smoker 11/04/2007  . FIBROCYSTIC BREAST DISEASE 10/27/2007  . ECZEMA 10/27/2007   Past Medical History:  Diagnosis Date  . Allergy   . Anxiety   . Eczema   . Fibrocystic breast   .  Hyperlipidemia   . Pneumonia   . Seizures ([place])    per pt had 1 seizure at age 58 and 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 maw  . Tobacco abuse    quit 2011   Past Surgical History:  Procedure Laterality Date  . BREAST CYST EXCISION    . pneumonia    . TONSILLECTOMY    . TUBAL LIGATION     Social History   Tobacco Use  . Smoking status: Current Every Day Smoker    Packs/day: 1.00    Types: Cigarettes  . Smokeless tobacco: Current User  . Tobacco comment: As of 04/08/2019, smokes 1 pack/day  Substance Use Topics  . Alcohol use: Yes    Alcohol/week: 0.0 standard drinks    Comment: occ  . Drug use: No   Family History  Problem Relation Age of Onset  . Diabetes Mother   . Hypertension Mother   . Ovarian cysts Mother   . Hypertension Father   . Asthma Father   . Cancer Cousin        melanoma  . Migraines Sister   . Cancer Maternal Grandmother        breast  . Cancer Paternal Grandfather        lung  . Colon cancer Neg Hx   . Esophageal cancer Neg Hx   . Stomach cancer Neg  Hx   . Rectal cancer Neg Hx    Allergies  Allergen Reactions  . Adhesive [Tape]     Per the pt , "when I were tape for a long time, days, it breaks out my skin". maw  . Neomycin-Bacitracin Zn-Polymyx     REACTION: rash  . Penicillins     REACTION: rash   Current Outpatient Medications on File Prior to Visit  Medication Sig Dispense Refill  . atorvastatin (LIPITOR) 20 MG tablet Take 1 tablet (20 mg total) by mouth daily. 90 tablet 3  . sertraline (ZOLOFT) 50 MG tablet TAKE 1 TABLET BY MOUTH EVERY DAY 90 tablet 0   No current facility-administered medications on file prior to visit.   Review of Systems  Constitutional: Positive for chills and malaise/fatigue. Negative for fever.  HENT: Positive for congestion and sinus pain. Negative for ear pain and sore throat.   Eyes: Negative for blurred vision, discharge and redness.  Respiratory: Positive for cough, sputum production and wheezing. Negative for  shortness of breath and stridor.   Cardiovascular: Negative for chest pain, palpitations and leg swelling.  Gastrointestinal: Negative for abdominal pain, diarrhea, nausea and vomiting.  Musculoskeletal: Negative for myalgias.  Skin: Negative for rash.  Neurological: Positive for headaches. Negative for dizziness.      Observations/Objective: Patient appears well, in no distress Weight is baseline  No facial swelling or asymmetry Voice is mildly hoarse No obvious tremor or mobility impairment Moving neck and UEs normally Able to hear the call well  Cough sounds wet and wheezy, no sob during interview  Talkative and mentally sharp with no cognitive changes No skin changes on face or neck , no rash or pallor Affect is normal    Assessment and Plan: Problem List Items Addressed This Visit      Respiratory   Acute bronchitis    Negative for covid Smoker  Some wheezing/some sinus pain  Px prednisone and albuterol mdi (side eff disc) Also hycodan for pm cough- warned of sedation Doxycycline due to length of illness (over 2 wk)  Update if not starting to improve in a week or if worsening   Also if any new symptoms         Other   Smoker - Primary    Disc in detail risks of smoking and possible outcomes including copd, vascular/ heart disease, cancer , respiratory and sinus infections  Pt voices understanding Pt does not feel ready to quit          Follow Up Instructions: Drink fluids and rest  Use albuterol inhaler and take prednisone for wheezing/tight chest  Hycodan for cough with caution of sedation  Doxycycline for possible sinus infection /bacterial bugs   Update if not starting to improve in a week or if worsening   If new symptoms let us know   Please consider quitting smoking    I discussed the assessment and treatment plan with the patient. The patient was provided an opportunity to ask questions and all were answered. The patient agreed with the plan and  demonstrated an understanding of the instructions.   The patient was advised to call back or seek an in-person evaluation if the symptoms worsen or if the condition fails to improve as anticipated.   Loura Pardon, MD

## 2019-12-23 ENCOUNTER — Telehealth: Payer: Self-pay | Admitting: *Deleted

## 2019-12-23 MED ORDER — AEROCHAMBER PLUS FLO-VU MISC: 0 refills | Status: DC

## 2019-12-23 NOTE — Telephone Encounter (Signed)
Received fax from pharmacy ([facility]) saying that if pt needs to get a spacer that Dr. Glori Bickers would have to send in a Rx for that, they can't accept just a note on her inhaler to get it because it's an actual Rx that needs to be sent in separately   [facility]

## 2019-12-23 NOTE — Telephone Encounter (Signed)
I had sent a spacer because she has not used an inhaler in the past.  If she wants it please call in 1 spacer, use as directed with MDI prn no refill

## 2019-12-24 ENCOUNTER — Ambulatory Visit: Payer: BC Managed Care – PPO | Admitting: Family Medicine

## 2020-01-23 ENCOUNTER — Ambulatory Visit: Payer: BC Managed Care – PPO | Attending: Internal Medicine

## 2020-01-23 DIAGNOSIS — Z23 Encounter for immunization: Secondary | ICD-10-CM | POA: Insufficient documentation

## 2020-01-23 NOTE — Progress Notes (Signed)
   Covid-19 Vaccination Clinic  Name:  Claudia Carter    MRN: [id number] DOB: 07-Jul-1966  01/23/2020  Claudia Carter was observed post Covid-19 immunization for 15 minutes without incident. She was provided with Vaccine Information Sheet and instruction to access the V-Safe system.   Claudia Carter was instructed to call 911 with any severe reactions post vaccine: Marland Kitchen Difficulty breathing  . Swelling of face and throat  . A fast heartbeat  . A bad rash all over body  . Dizziness and weakness   Immunizations Administered    Name Date Dose VIS Date Route   Pfizer COVID-19 Vaccine 01/23/2020 10:24 AM 0.3 mL 10/29/2019 Intramuscular   Manufacturer: [place]   Lot: [id number]   [place]: [id number]

## 2020-02-15 ENCOUNTER — Ambulatory Visit: Payer: BC Managed Care – PPO | Attending: Internal Medicine

## 2020-02-15 DIAGNOSIS — Z23 Encounter for immunization: Secondary | ICD-10-CM

## 2020-02-15 NOTE — Progress Notes (Signed)
   Covid-19 Vaccination Clinic  Name:  Claudia Carter    MRN: [id number] DOB: 1966-04-08  02/15/2020  [name] was observed post Covid-19 immunization for 15 minutes without incident. She was provided with Vaccine Information Sheet and instruction to access the V-Safe system.   [name] was instructed to call 911 with any severe reactions post vaccine: Marland Kitchen Difficulty breathing  . Swelling of face and throat  . A fast heartbeat  . A bad rash all over body  . Dizziness and weakness   Immunizations Administered    Name Date Dose VIS Date Route   Pfizer COVID-19 Vaccine 02/15/2020  4:48 PM 0.3 mL 10/29/2019 Intramuscular   Manufacturer: [place]   Lot: [id number]   [place]: [id number]

## 2020-03-14 ENCOUNTER — Other Ambulatory Visit: Payer: Self-pay | Admitting: Family Medicine

## 2020-04-26 ENCOUNTER — Telehealth: Payer: Self-pay | Admitting: Family Medicine

## 2020-04-26 DIAGNOSIS — E7800 Pure hypercholesterolemia, unspecified: Secondary | ICD-10-CM

## 2020-04-26 DIAGNOSIS — Z0000 Encounter for general adult medical examination without abnormal findings: Secondary | ICD-10-CM

## 2020-04-26 NOTE — Telephone Encounter (Signed)
-----   Message from Ellamae Sia sent at 04/12/2020 12:22 PM EDT ----- Regarding: Lab orders for Friday, 6.11.21 Patient is scheduled for CPX labs, please order future labs, Thanks , Karna Christmas

## 2020-04-27 ENCOUNTER — Other Ambulatory Visit: Payer: Self-pay

## 2020-04-28 ENCOUNTER — Other Ambulatory Visit (INDEPENDENT_AMBULATORY_CARE_PROVIDER_SITE_OTHER): Payer: BC Managed Care – PPO

## 2020-04-28 DIAGNOSIS — E7800 Pure hypercholesterolemia, unspecified: Secondary | ICD-10-CM

## 2020-04-28 DIAGNOSIS — Z0000 Encounter for general adult medical examination without abnormal findings: Secondary | ICD-10-CM

## 2020-04-28 LAB — LDL CHOLESTEROL, DIRECT: Direct LDL: 147 mg/dL

## 2020-04-28 LAB — CBC WITH DIFFERENTIAL/PLATELET
Basophils Absolute: 0 10*3/uL (ref 0.0–0.1)
Basophils Relative: 0.7 % (ref 0.0–3.0)
Eosinophils Absolute: 0.3 10*3/uL (ref 0.0–0.7)
Eosinophils Relative: 4.7 % (ref 0.0–5.0)
HCT: 43.2 % (ref 36.0–46.0)
Hemoglobin: 15 g/dL (ref 12.0–15.0)
Lymphocytes Relative: 40.3 % (ref 12.0–46.0)
Lymphs Abs: 2.2 10*3/uL (ref 0.7–4.0)
MCHC: 34.8 g/dL (ref 30.0–36.0)
MCV: 94.1 fl (ref 78.0–100.0)
Monocytes Absolute: 0.4 10*3/uL (ref 0.1–1.0)
Monocytes Relative: 7.1 % (ref 3.0–12.0)
Neutro Abs: 2.5 10*3/uL (ref 1.4–7.7)
Neutrophils Relative %: 47.2 % (ref 43.0–77.0)
Platelets: 216 10*3/uL (ref 150.0–400.0)
RBC: 4.59 Mil/uL (ref 3.87–5.11)
RDW: 12.2 % (ref 11.5–15.5)
WBC: 5.4 10*3/uL (ref 4.0–10.5)

## 2020-04-28 LAB — COMPREHENSIVE METABOLIC PANEL
ALT: 12 U/L (ref 0–35)
AST: 16 U/L (ref 0–37)
Albumin: 4.8 g/dL (ref 3.5–5.2)
Alkaline Phosphatase: 73 U/L (ref 39–117)
BUN: 16 mg/dL (ref 6–23)
CO2: 30 mEq/L (ref 19–32)
Calcium: 9.8 mg/dL (ref 8.4–10.5)
Chloride: 100 mEq/L (ref 96–112)
Creatinine, Ser: 0.84 mg/dL (ref 0.40–1.20)
GFR: 70.4 mL/min (ref >60.00)
Glucose, Bld: 93 mg/dL (ref 70–99)
Potassium: 4.2 mEq/L (ref 3.5–5.1)
Sodium: 137 mEq/L (ref 135–145)
Total Bilirubin: 0.6 mg/dL (ref 0.2–1.2)
Total Protein: 7.3 g/dL (ref 6.0–8.3)

## 2020-04-28 LAB — LIPID PANEL
Cholesterol: 273 mg/dL — ABNORMAL HIGH (ref 0–200)
HDL: 52.3 mg/dL (ref >39.00)
Total CHOL/HDL Ratio: 5
Triglycerides: 404 mg/dL — ABNORMAL HIGH (ref 0.0–149.0)

## 2020-04-28 LAB — TSH: TSH: 3.47 u[IU]/mL (ref 0.35–4.50)

## 2020-05-05 ENCOUNTER — Encounter: Payer: BC Managed Care – PPO | Admitting: Family Medicine

## 2020-05-16 ENCOUNTER — Ambulatory Visit (INDEPENDENT_AMBULATORY_CARE_PROVIDER_SITE_OTHER): Payer: BC Managed Care – PPO | Admitting: Family Medicine

## 2020-05-16 ENCOUNTER — Other Ambulatory Visit: Payer: Self-pay

## 2020-05-16 ENCOUNTER — Encounter: Payer: Self-pay | Admitting: Family Medicine

## 2020-05-16 VITALS — BP 128/80 | HR 89 | Temp 97.7°F | Ht 66.0 in | Wt 166.1 lb

## 2020-05-16 DIAGNOSIS — Z0000 Encounter for general adult medical examination without abnormal findings: Secondary | ICD-10-CM

## 2020-05-16 DIAGNOSIS — F17200 Nicotine dependence, unspecified, uncomplicated: Secondary | ICD-10-CM

## 2020-05-16 DIAGNOSIS — E7800 Pure hypercholesterolemia, unspecified: Secondary | ICD-10-CM | POA: Diagnosis not present

## 2020-05-16 DIAGNOSIS — N6019 Diffuse cystic mastopathy of unspecified breast: Secondary | ICD-10-CM

## 2020-05-16 MED ORDER — ROSUVASTATIN CALCIUM 5 MG PO TABS: 5.0000 mg | ORAL_TABLET | Freq: Every day | ORAL | 3 refills | Status: DC

## 2020-05-16 NOTE — Patient Instructions (Addendum)
Let's give generic crestor a try at 5 mg for cholesterol  Keep watching diet  Plan labs for 6 weeks  If side effects please stop it and let us know   Avoid red meat/ fried foods/ egg yolks/ fatty breakfast meats/ butter, cheese and high fat dairy/ and shellfish    Keep thinking about quitting smoking

## 2020-05-16 NOTE — Assessment & Plan Note (Signed)
Disc in detail risks of smoking and possible outcomes including copd, vascular/ heart disease, cancer , respiratory and sinus infections  Pt voices understanding She is not ready to quit currently  Also did not tolerate chantix well

## 2020-05-16 NOTE — Assessment & Plan Note (Signed)
Negative bx last august

## 2020-05-16 NOTE — Progress Notes (Signed)
Subjective:    Patient ID: Claudia Carter, female    DOB: [birth year], 54 y.o.   MRN: [id number]  This visit occurred during the SARS-CoV-2 public health emergency.  Safety protocols were in place, including screening questions prior to the visit, additional usage of staff PPE, and extensive cleaning of exam room while observing appropriate contact time as indicated for disinfecting solutions.    HPI Here for health maintenance exam and to review chronic medical problems    Wt Readings from Last 3 Encounters:  05/16/20 166 lb 1.6 oz (75.3 kg)  05/05/19 182 lb 6 oz (82.7 kg)  04/16/19 183 lb (83 kg)  26.81 kg/m  Pap 2/18 neg with neg HPV screen  Will put off pap another year  No new exposures  No gyn symptoms   occ hot flashes- not as bad as they used to be   Feeling good  Lost significant weight  Using ww program -thinks she can stick with it   Exercise- walking primarily   Flu shot 3/19- did not get this fall  covid status -immunized  Tdap 1/13   Mammogram 8/20 -followed by bx-it was normal (fibrocystic) Self breast exam -no lumps   Colonoscopy 2/17 with 10 y recall   Smoking status - still smoked less than 1ppd  Always thinking about quitting but no date or plan yet  chantix gave her weird dreams    BP Readings from Last 3 Encounters:  05/16/20 128/80  05/05/19 130/76  04/08/19 110/88   Pulse Readings from Last 3 Encounters:  05/16/20 89  05/05/19 75  04/08/19 98    Hyperlipidemia Lab Results  Component Value Date   CHOL 273 (H) 04/28/2020   CHOL 166 06/25/2019   CHOL 228 (H) 05/05/2019   Lab Results  Component Value Date   HDL 52.30 04/28/2020   HDL 44.90 06/25/2019   HDL 46.00 05/05/2019   Lab Results  Component Value Date   LDLCALC 82 01/17/2012   Lab Results  Component Value Date   TRIG (H) 04/28/2020    404.0 Triglyceride is over 400; calculations on Lipids are invalid.   TRIG 201.0 (H) 06/25/2019   TRIG 594.0 (H) 05/05/2019    Lab Results  Component Value Date   CHOLHDL 5 04/28/2020   CHOLHDL 4 06/25/2019   CHOLHDL 5 05/05/2019   Lab Results  Component Value Date   LDLDIRECT 147.0 04/28/2020   LDLDIRECT 99.0 06/25/2019   LDLDIRECT 109.0 05/05/2019   LDL and triglycerides are up significantly  Stopped taking her atorvastatin  Wanted to see how she felt off of it  Wants to try something different   Stopped sertraline- does not feel she needs it  Also affected libido   Other labs Results for orders placed or performed in visit on 04/28/20  TSH  Result Value Ref Range   TSH 3.47 0.35 - 4.50 uIU/mL  Lipid panel  Result Value Ref Range   Cholesterol 273 (H) 0 - 200 mg/dL   Triglycerides (H) 0 - 149 mg/dL    404.0 Triglyceride is over 400; calculations on Lipids are invalid.   HDL 52.30 >39.00 mg/dL   Total CHOL/HDL Ratio 5   CBC with Differential/Platelet  Result Value Ref Range   WBC 5.4 4.0 - 10.5 K/uL   RBC 4.59 3.87 - 5.11 Mil/uL   Hemoglobin 15.0 12.0 - 15.0 g/dL   HCT 43.2 36 - 46 %   MCV 94.1 78.0 - 100.0 fl   MCHC 34.8  30.0 - 36.0 g/dL   RDW 12.2 11.5 - 15.5 %   Platelets 216.0 150 - 400 K/uL   Neutrophils Relative % 47.2 43 - 77 %   Lymphocytes Relative 40.3 12 - 46 %   Monocytes Relative 7.1 3 - 12 %   Eosinophils Relative 4.7 0 - 5 %   Basophils Relative 0.7 0 - 3 %   Neutro Abs 2.5 1.4 - 7.7 K/uL   Lymphs Abs 2.2 0.7 - 4.0 K/uL   Monocytes Absolute 0.4 0 - 1 K/uL   Eosinophils Absolute 0.3 0 - 0 K/uL   Basophils Absolute 0.0 0 - 0 K/uL  Comprehensive metabolic panel  Result Value Ref Range   Sodium 137 135 - 145 mEq/L   Potassium 4.2 3.5 - 5.1 mEq/L   Chloride 100 96 - 112 mEq/L   CO2 30 19 - 32 mEq/L   Glucose, Bld 93 70 - 99 mg/dL   BUN 16 6 - 23 mg/dL   Creatinine, Ser 0.84 0.40 - 1.20 mg/dL   Total Bilirubin 0.6 0.2 - 1.2 mg/dL   Alkaline Phosphatase 73 39 - 117 U/L   AST 16 0 - 37 U/L   ALT 12 0 - 35 U/L   Total Protein 7.3 6.0 - 8.3 g/dL   Albumin 4.8 3.5 -  5.2 g/dL   GFR 70.40 >60.00 mL/min   Calcium 9.8 8.4 - 10.5 mg/dL  LDL cholesterol, direct  Result Value Ref Range   Direct LDL 147.0 mg/dL    Patient Active Problem List   Diagnosis Date Noted  . Somnolence 01/18/2019  . Snoring 01/18/2019  . Screening mammogram, encounter for 12/20/2016  . Colon cancer screening 09/06/2015  . Encounter for routine gynecological examination 10/11/2013  . Routine general medical examination at a health care facility 11/26/2011  . Hyperlipidemia 12/08/2008  . Smoker 11/04/2007  . FIBROCYSTIC BREAST DISEASE 10/27/2007  . ECZEMA 10/27/2007   Past Medical History:  Diagnosis Date  . Allergy   . Anxiety   . Eczema   . Fibrocystic breast   . Hyperlipidemia   . Pneumonia   . Seizures ([place])    per pt had 1 seizure at age 64 and 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 maw  . Tobacco abuse    quit 2011   Past Surgical History:  Procedure Laterality Date  . BREAST CYST EXCISION    . pneumonia    . TONSILLECTOMY    . TUBAL LIGATION     Social History   Tobacco Use  . Smoking status: Current Every Day Smoker    Packs/day: 1.00    Types: Cigarettes  . Smokeless tobacco: Current User  . Tobacco comment: As of 04/08/2019, smokes 1 pack/day  Substance Use Topics  . Alcohol use: Yes    Alcohol/week: 0.0 standard drinks    Comment: occ  . Drug use: No   Family History  Problem Relation Age of Onset  . Diabetes Mother   . Hypertension Mother   . Ovarian cysts Mother   . Hypertension Father   . Asthma Father   . Cancer Cousin        melanoma  . Migraines Sister   . Cancer Maternal Grandmother        breast  . Cancer Paternal Grandfather        lung  . Colon cancer Neg Hx   . Esophageal cancer Neg Hx   . Stomach cancer Neg Hx   . Rectal cancer Neg Hx  Allergies  Allergen Reactions  . Adhesive [Tape]     Per the pt , "when I were tape for a long time, days, it breaks out my skin". maw  . Neomycin-Bacitracin Zn-Polymyx     REACTION: rash  .  Penicillins     REACTION: rash   Current Outpatient Medications on File Prior to Visit  Medication Sig Dispense Refill  . albuterol (VENTOLIN HFA) 108 (90 Base) MCG/ACT inhaler Inhale 2 puffs into the lungs every 4 (four) hours as needed for wheezing or shortness of breath. (Patient not taking: Reported on 05/16/2020) 18 g 0  . Spacer/Aero-Holding Chambers (AEROCHAMBER PLUS WITH MASK) inhaler Use with inhaler as needed (Patient not taking: Reported on 05/16/2020) 1 each 0   No current facility-administered medications on file prior to visit.       Review of Systems  Constitutional: Negative for activity change, appetite change, fatigue, fever and unexpected weight change.  HENT: Negative for congestion, ear pain, rhinorrhea, sinus pressure and sore throat.   Eyes: Negative for pain, redness and visual disturbance.  Respiratory: Negative for cough, shortness of breath and wheezing.   Cardiovascular: Negative for chest pain and palpitations.  Gastrointestinal: Negative for abdominal pain, blood in stool, constipation and diarrhea.  Endocrine: Negative for polydipsia and polyuria.  Genitourinary: Negative for dysuria, frequency and urgency.  Musculoskeletal: Negative for arthralgias, back pain and myalgias.  Skin: Negative for pallor and rash.  Allergic/Immunologic: Negative for environmental allergies.  Neurological: Negative for dizziness, syncope and headaches.  Hematological: Negative for adenopathy. Does not bruise/bleed easily.  Psychiatric/Behavioral: Negative for decreased concentration and dysphoric mood. The patient is not nervous/anxious.        Objective:   Physical Exam Constitutional:      General: She is not in acute distress.    Appearance: Normal appearance. She is well-developed and normal weight. She is not ill-appearing or diaphoretic.  HENT:     Head: Normocephalic and atraumatic.     Right Ear: Tympanic membrane, ear canal and external ear normal.     Left Ear:  Tympanic membrane, ear canal and external ear normal.     Nose: Nose normal. No congestion.     Mouth/Throat:     Mouth: Mucous membranes are moist.     Pharynx: Oropharynx is clear. No posterior oropharyngeal erythema.  Eyes:     General: No scleral icterus.    Extraocular Movements: Extraocular movements intact.     Conjunctiva/sclera: Conjunctivae normal.     Pupils: Pupils are equal, round, and reactive to light.  Neck:     Thyroid: No thyromegaly.     Vascular: No carotid bruit or JVD.  Cardiovascular:     Rate and Rhythm: Normal rate and regular rhythm.     Pulses: Normal pulses.     Heart sounds: Normal heart sounds. No gallop.   Pulmonary:     Effort: Pulmonary effort is normal. No respiratory distress.     Breath sounds: Normal breath sounds. No wheezing.     Comments: Good air exch Chest:     Chest wall: No tenderness.  Abdominal:     General: Bowel sounds are normal. There is no distension or abdominal bruit.     Palpations: Abdomen is soft. There is no mass.     Tenderness: There is no abdominal tenderness.     Hernia: No hernia is present.  Genitourinary:    Comments: Breast exam: No mass, nodules, thickening, tenderness, bulging, retraction, inflamation, nipple discharge or skin  changes noted.  No axillary or clavicular LA.     Very dense breast tissue  Musculoskeletal:        General: No tenderness. Normal range of motion.     Cervical back: Normal range of motion and neck supple. No rigidity. No muscular tenderness.     Right lower leg: No edema.     Left lower leg: No edema.     Comments: No kyphosis   Lymphadenopathy:     Cervical: No cervical adenopathy.  Skin:    General: Skin is warm and dry.     Coloration: Skin is not pale.     Findings: No erythema or rash.     Comments: Solar lentigines diffusely   Neurological:     Mental Status: She is alert. Mental status is at baseline.     Cranial Nerves: No cranial nerve deficit.     Motor: No abnormal  muscle tone.     Coordination: Coordination normal.     Gait: Gait normal.     Deep Tendon Reflexes: Reflexes are normal and symmetric. Reflexes normal.  Psychiatric:        Mood and Affect: Mood normal.        Cognition and Memory: Cognition and memory normal.           Assessment & Plan:   Problem List Items Addressed This Visit      Other   Hyperlipidemia    Pt stopped atorvastatin Open to trying low dose crestor 5 mg daily  Disc goals for lipids and reasons to control them Rev last labs with pt Rev low sat fat diet in detail Will plan re check in about 6 weeks       Relevant Medications   rosuvastatin (CRESTOR) 5 MG tablet   FIBROCYSTIC BREAST DISEASE    Negative bx last august        Smoker    Disc in detail risks of smoking and possible outcomes including copd, vascular/ heart disease, cancer , respiratory and sinus infections  Pt voices understanding She is not ready to quit currently  Also did not tolerate chantix well        Routine general medical examination at a health care facility - Primary    Reviewed health habits including diet and exercise and skin cancer prevention Reviewed appropriate screening tests for age  Also reviewed health mt list, fam hx and immunization status , as well as social and family history   See HPI Doing well w/o sertraline  Wt loss commended with ww program  Enc to consider smoking cessation  Immunized for covid  Plans to get mammogram in august  Will plan pap in a year  Plan made to tx high cholesterol

## 2020-05-16 NOTE — Assessment & Plan Note (Signed)
Reviewed health habits including diet and exercise and skin cancer prevention Reviewed appropriate screening tests for age  Also reviewed health mt list, fam hx and immunization status , as well as social and family history   See HPI Doing well w/o sertraline  Wt loss commended with ww program  Enc to consider smoking cessation  Immunized for covid  Plans to get mammogram in august  Will plan pap in a year  Plan made to tx high cholesterol

## 2020-05-16 NOTE — Assessment & Plan Note (Signed)
Pt stopped atorvastatin Open to trying low dose crestor 5 mg daily  Disc goals for lipids and reasons to control them Rev last labs with pt Rev low sat fat diet in detail Will plan re check in about 6 weeks

## 2020-08-29 IMAGING — MG DIGITAL DIAGNOSTIC UNILATERAL LEFT MAMMOGRAM
2 series · 2 of 2 positions shown · non-contrast
Comparison: Previous exam(s).

CLINICAL DATA: Left breast calcifications noted on most recent
screening mammogram.

EXAM:
DIGITAL DIAGNOSTIC LEFT MAMMOGRAM

[L CC]
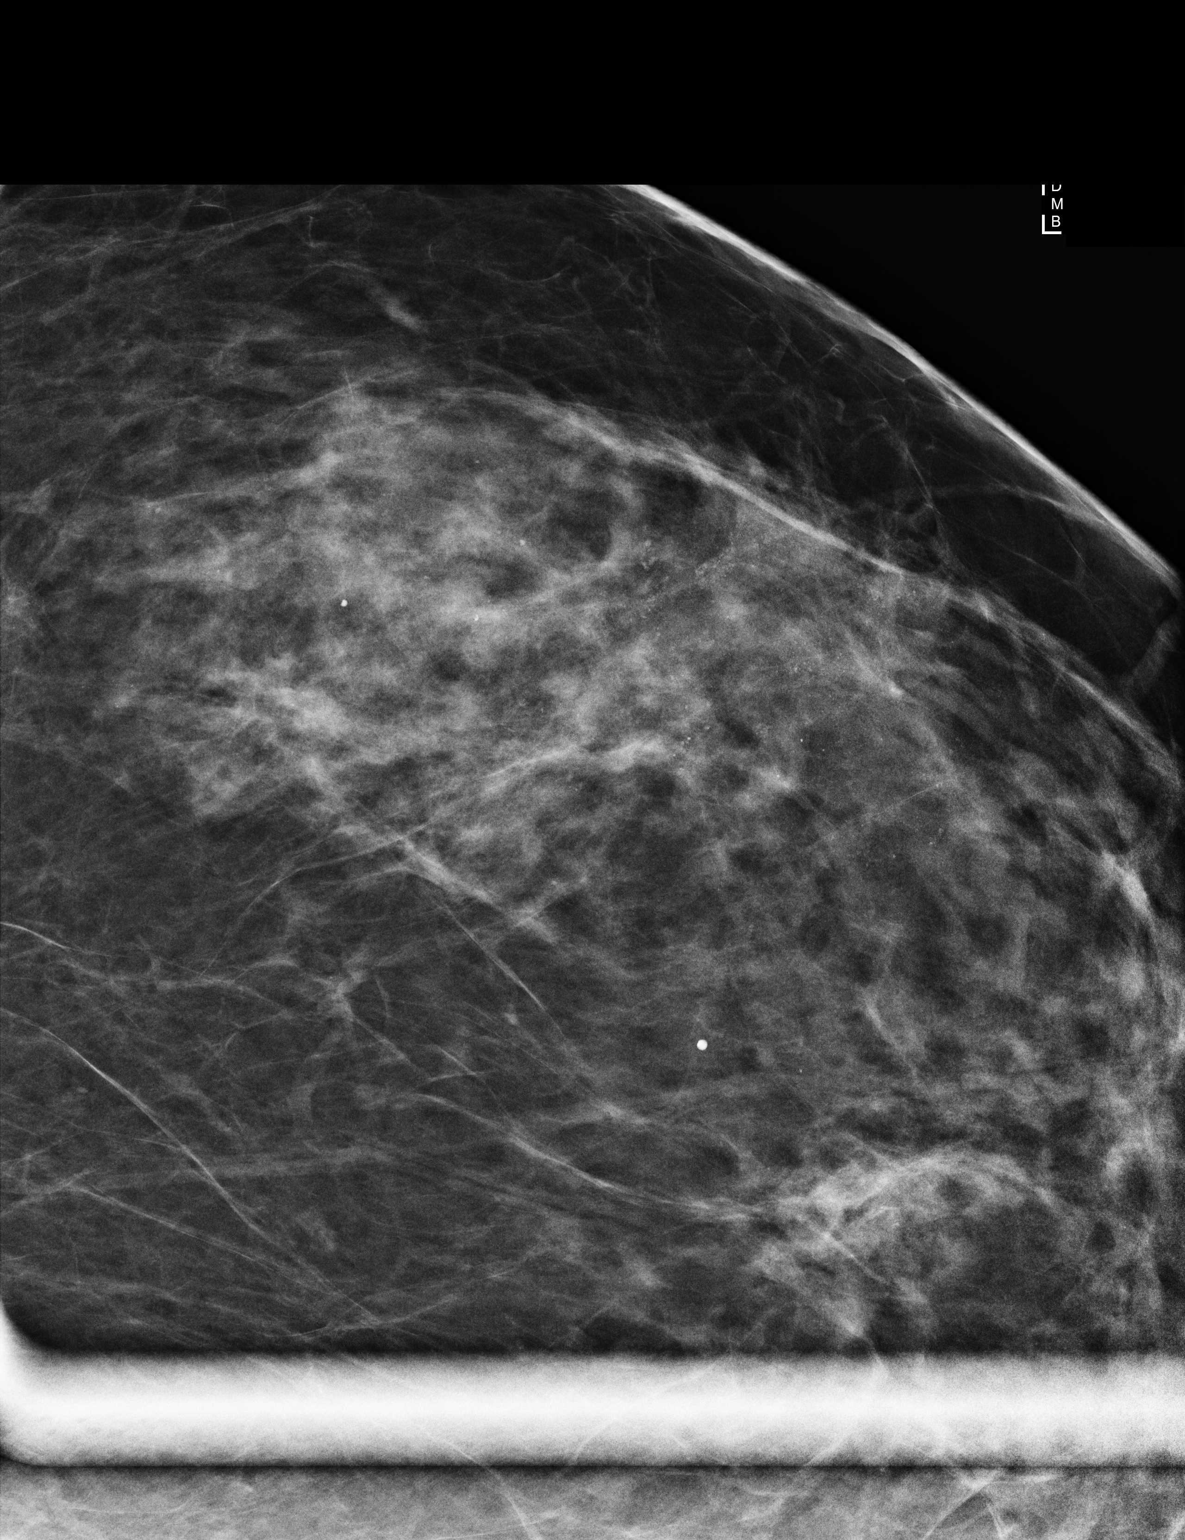

[L ML]
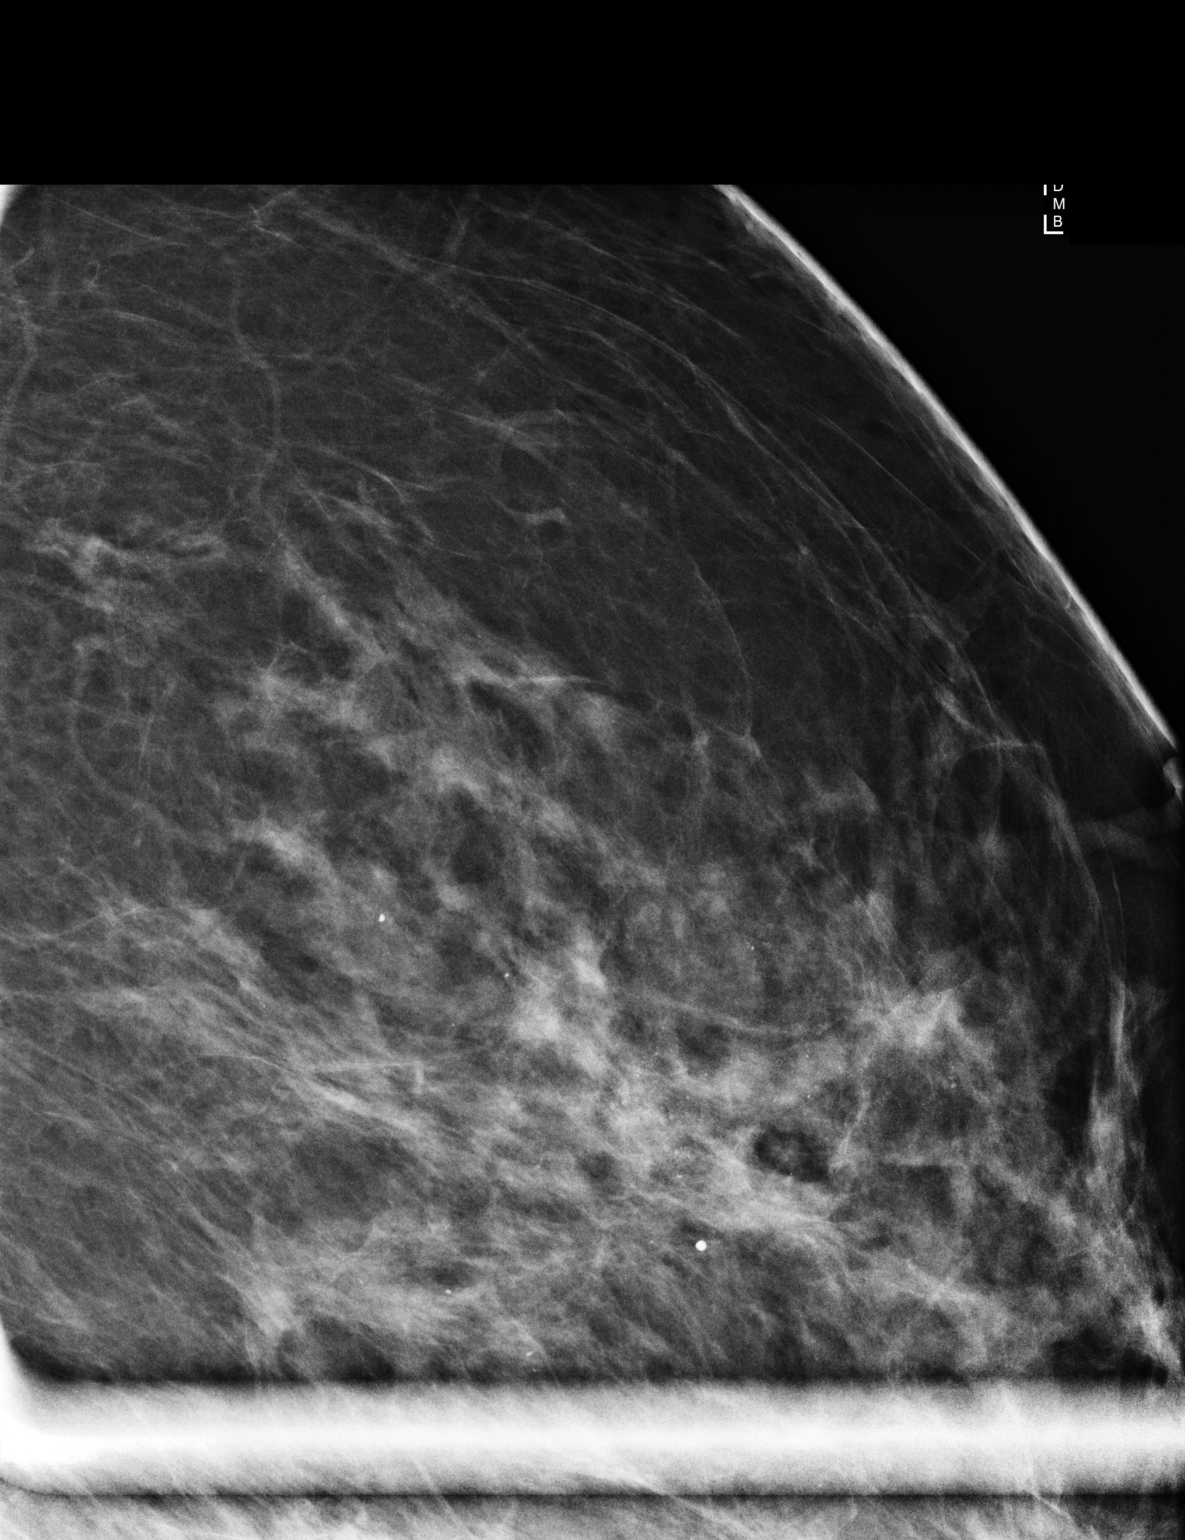

[2 of 2 positions shown; findings below may reference images not displayed]

ACR Breast Density Category c: The breast tissue is heterogeneously
dense, which may obscure small masses.
FINDINGS: Compression magnification views of the left breast demonstrate
diffusely present punctate and indistinct calcifications in the
upper outer quadrant, middle depth, in regional distribution. There
is no associated mass. Although the morphology of these
calcifications is predominantly benign, there is some pleomorphism
in size and shape, which places these calcifications in the
indeterminate category. The extent of calcifications measures 7.6 by
3.9 by 4.9 cm.
IMPRESSION: Left breast upper outer quadrant large group of indeterminate
calcifications.

RECOMMENDATION:
Given the mostly benign morphology of these calcifications, only 1
stereotactic core needle biopsy is recommended. A potential biopsy
site containing the most pleomorphic forms is marked on the
magnification CC view. If the pathology results are benign no
further specific imaging follow-up is recommended. Otherwise, a
stereotactic core needle biopsy of the anterior and posterior
extends of the group, or contrast-enhanced breast MRI, is
recommended to determine extend of disease.

I have discussed the findings and recommendations with the patient.
Results were also provided in writing at the conclusion of the
visit. If applicable, a reminder letter will be sent to the patient
regarding the next appointment.

BI-RADS CATEGORY  4: Suspicious.

## 2020-09-01 IMAGING — MG STEREOTACTIC CORE NEEDLE BIOPSY
7 series · 8 of 23 positions shown · non-contrast
Comparison: Previous exams.
COMPARISON: Previous exams.

Addendum:
CLINICAL DATA: 53-year-old female presenting for stereotactic
biopsy of left breast calcifications.

EXAM:
LEFT BREAST STEREOTACTIC CORE NEEDLE BIOPSY

[L CC (1 of 3)]
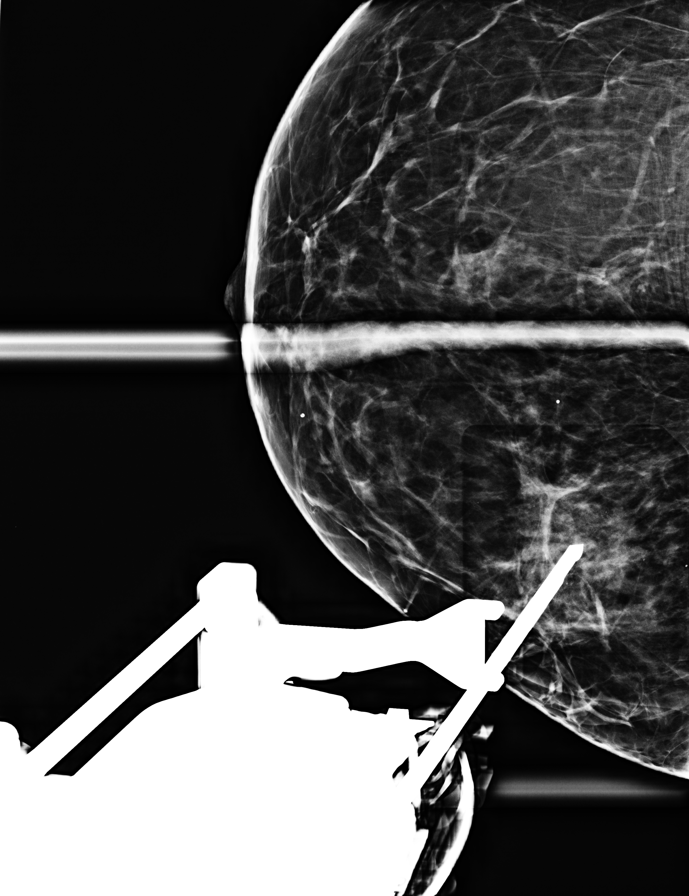

[L CC (2 of 3)]
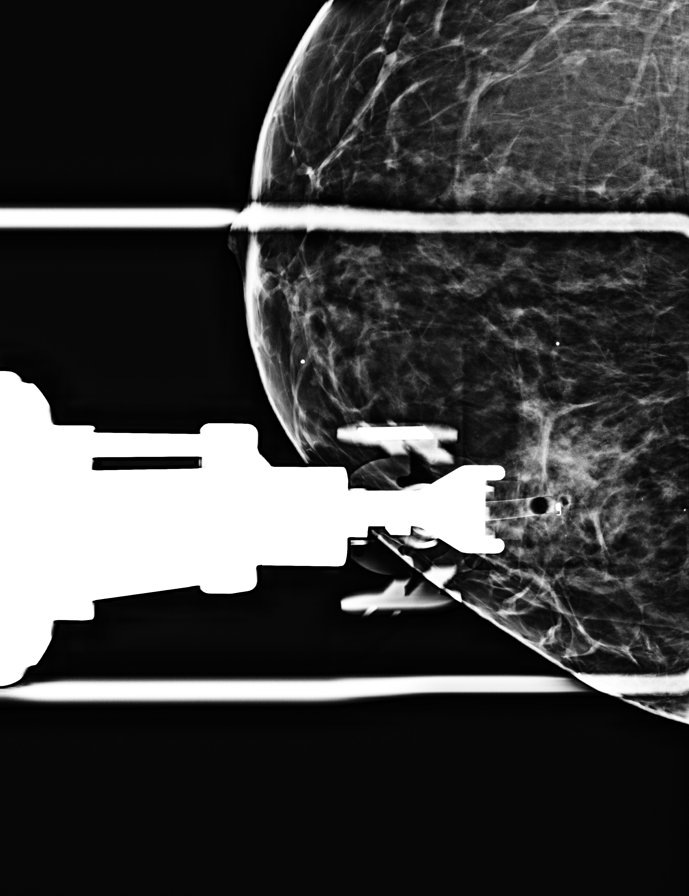

[L CC (3 of 3)]
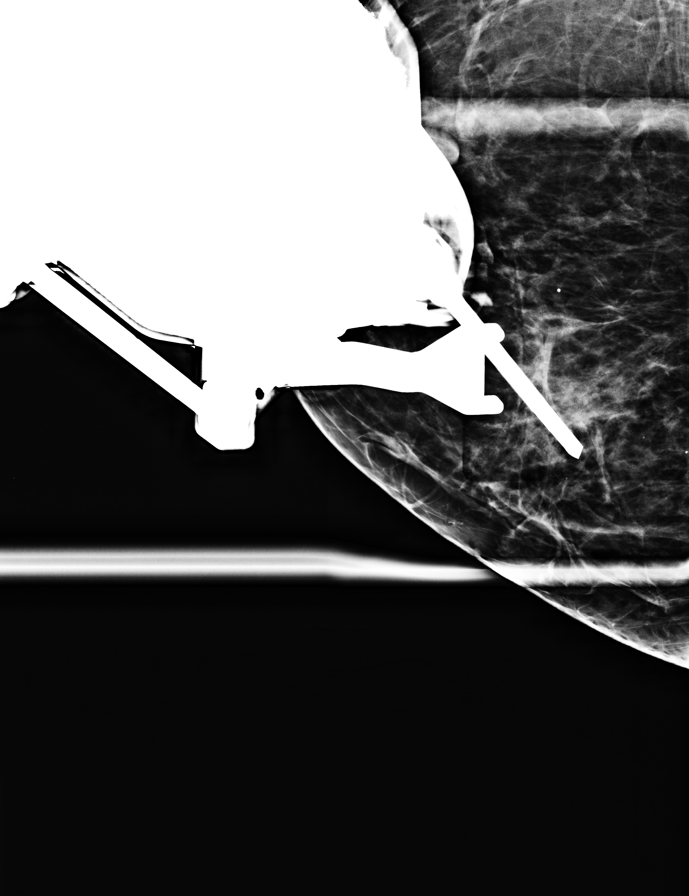

[L CC tomo · 2 of 55 frames shown (1 of 4)]
[frame 18/55]
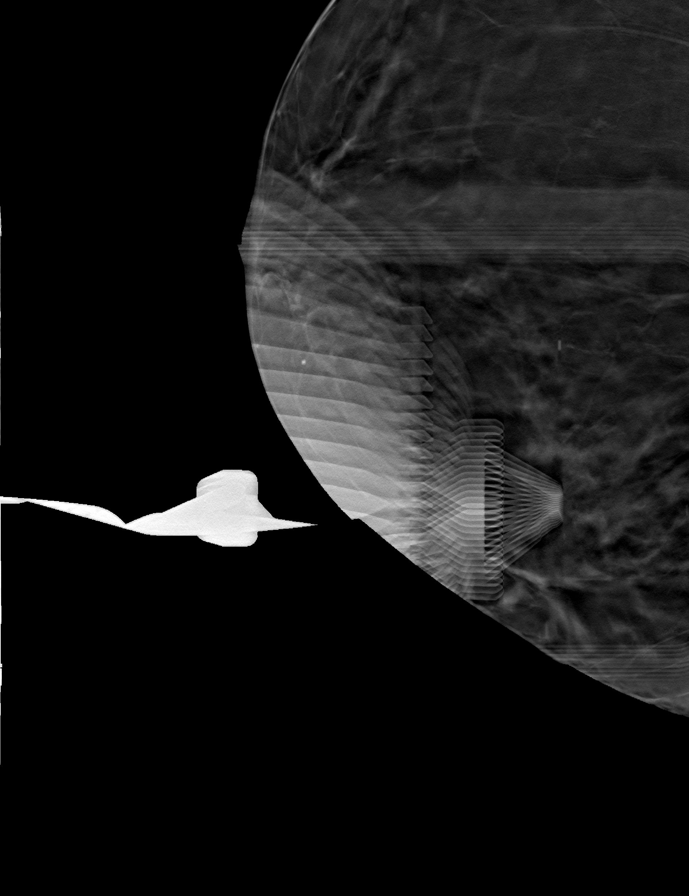
[frame 28/55]
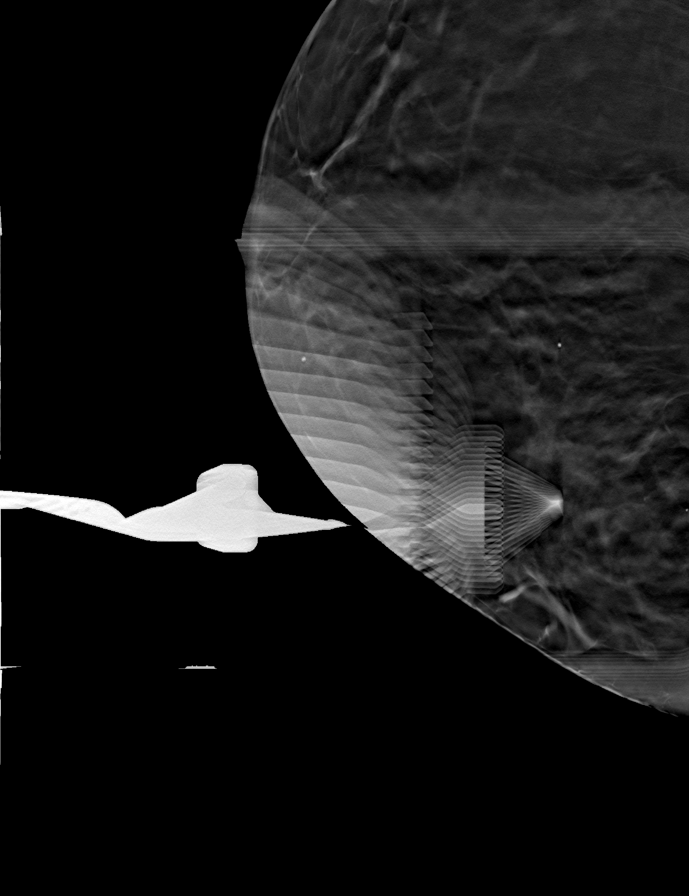

[L CC tomo (2 of 4) · tomo slice 31/62.0]
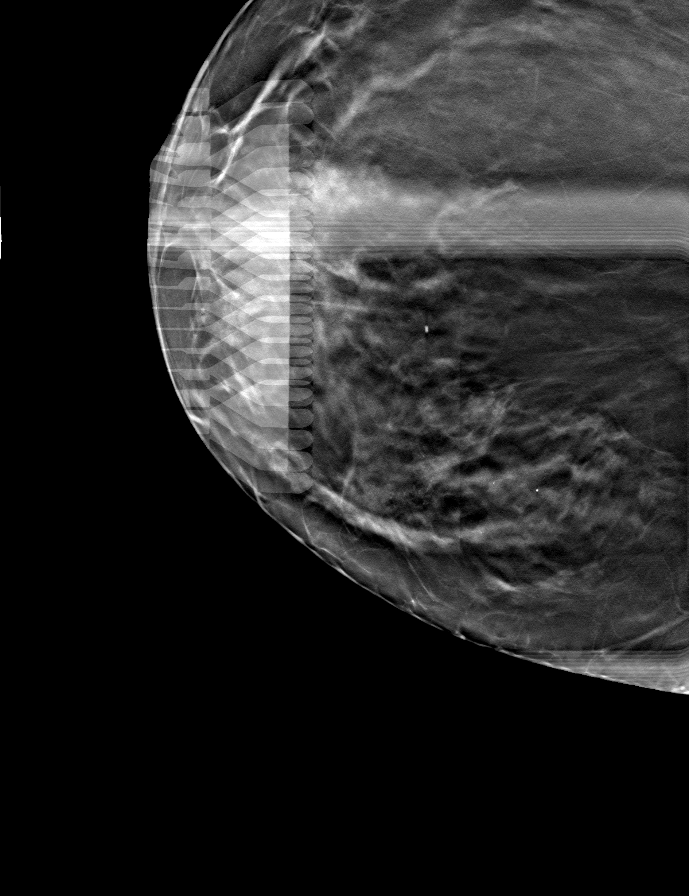

[L CC tomo (3 of 4) · tomo slice 29/56.0]
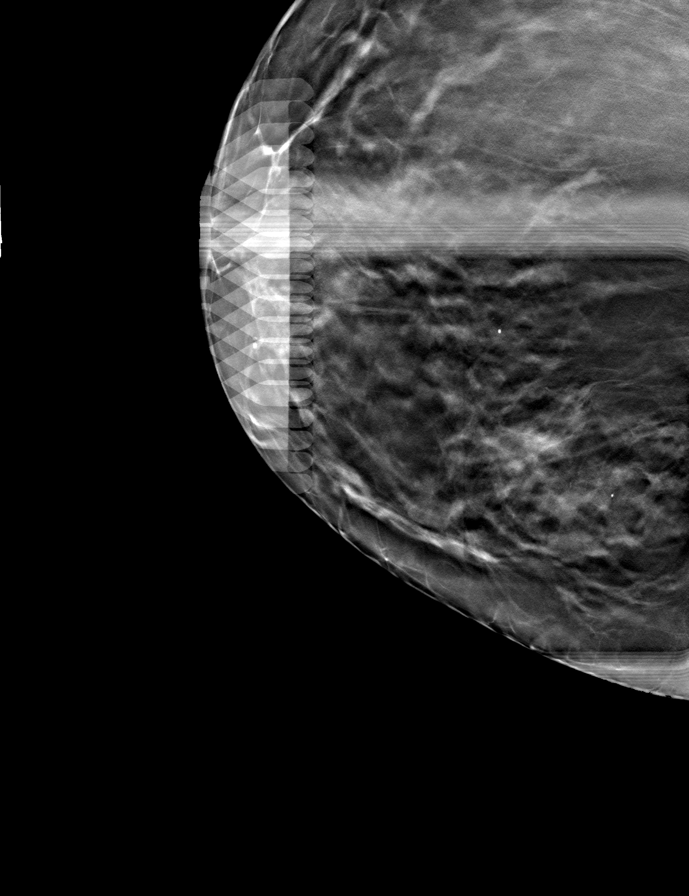

[L CC tomo (4 of 4) · tomo slice 28/55.0]
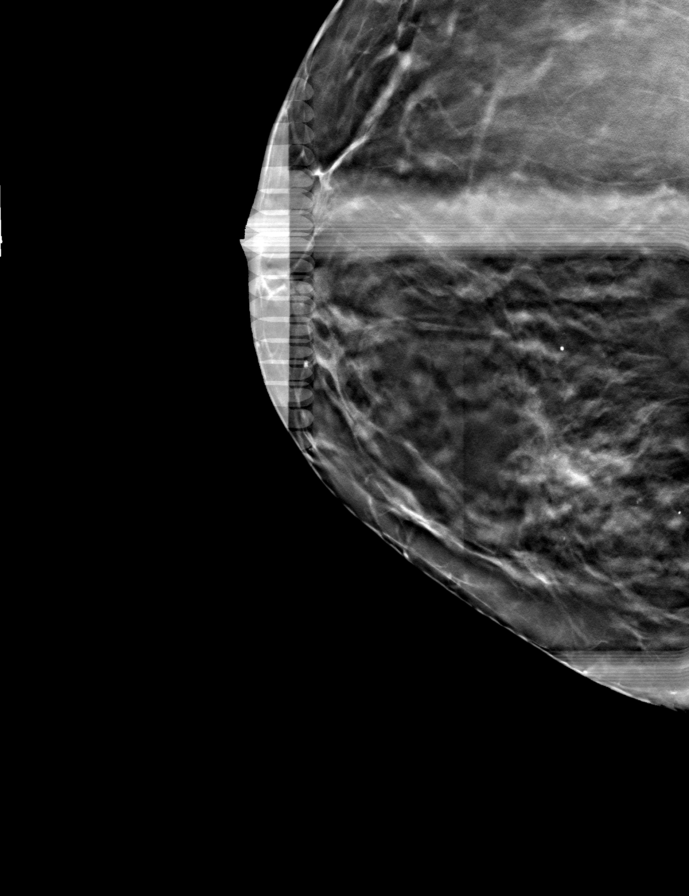

[8 of 23 positions shown; findings below may reference images not displayed]



Using sterile technique and 1% Lidocaine as local anesthetic, under
stereotactic guidance, a 9 gauge vacuum assisted device was used to
perform core needle biopsy of calcifications in the upper-outer
quadrant of the left breast using a superior approach. Specimen
radiograph was performed showing no definite calcifications within
the samples, however pre biopsy images demonstrate that the needle
tip was on target, and the calcifications appeared amorphous on the
diagnostic images, which may sometimes not be well seen on the
specimen images.

Lesion quadrant: Upper outer quadrant

At the conclusion of the procedure, a coil shaped tissue marker clip
was deployed into the biopsy cavity. Follow-up 2-view mammogram was
performed and dictated separately.
IMPRESSION: Stereotactic-guided biopsy of calcifications in the upper-outer left
breast. No apparent complications.

ADDENDUM:
Pathology revealed BENIGN BREAST PARENCHYMA of the Left breast,upper
outer quadrant. This was found to be concordant by Dr. Kateko
Alkhafaji, with re-biopsy recommended as no calcifications were seen
by pathology in the samples.

Pathology results were discussed with the patient by telephone. The
patient reported doing well after the biopsy with tenderness at the
site. Post biopsy instructions and care were reviewed and questions
were answered. The patient was encouraged to call The [REDACTED]

The patient is scheduled for a Left breast stereotatic biopsy on
July 08, 2019. Further recommendations will be guided by the
results of this biopsy.

Pathology results reported by Niicolas Ramiirez, RN on 07/06/2019.



Using sterile technique and 1% Lidocaine as local anesthetic, under
stereotactic guidance, a 9 gauge vacuum assisted device was used to
perform core needle biopsy of calcifications in the upper-outer
quadrant of the left breast using a superior approach. Specimen
radiograph was performed showing no definite calcifications within
the samples, however pre biopsy images demonstrate that the needle
tip was on target, and the calcifications appeared amorphous on the
diagnostic images, which may sometimes not be well seen on the
specimen images.

Lesion quadrant: Upper outer quadrant

At the conclusion of the procedure, a coil shaped tissue marker clip
was deployed into the biopsy cavity. Follow-up 2-view mammogram was
performed and dictated separately.
IMPRESSION: Stereotactic-guided biopsy of calcifications in the upper-outer left
breast. No apparent complications.

## 2020-12-07 ENCOUNTER — Other Ambulatory Visit: Payer: Self-pay | Admitting: Family Medicine

## 2020-12-07 DIAGNOSIS — Z1231 Encounter for screening mammogram for malignant neoplasm of breast: Secondary | ICD-10-CM

## 2020-12-13 DIAGNOSIS — Z1283 Encounter for screening for malignant neoplasm of skin: Secondary | ICD-10-CM | POA: Diagnosis not present

## 2020-12-13 DIAGNOSIS — L821 Other seborrheic keratosis: Secondary | ICD-10-CM | POA: Diagnosis not present

## 2020-12-13 DIAGNOSIS — D225 Melanocytic nevi of trunk: Secondary | ICD-10-CM | POA: Diagnosis not present

## 2020-12-13 DIAGNOSIS — D485 Neoplasm of uncertain behavior of skin: Secondary | ICD-10-CM | POA: Diagnosis not present

## 2021-01-18 ENCOUNTER — Inpatient Hospital Stay: Admission: RE | Admit: 2021-01-18 | Payer: BC Managed Care – PPO | Source: Ambulatory Visit

## 2021-02-21 ENCOUNTER — Other Ambulatory Visit: Payer: Self-pay

## 2021-02-21 ENCOUNTER — Ambulatory Visit (INDEPENDENT_AMBULATORY_CARE_PROVIDER_SITE_OTHER): Payer: BC Managed Care – PPO | Admitting: Family Medicine

## 2021-02-21 ENCOUNTER — Telehealth: Payer: Self-pay

## 2021-02-21 ENCOUNTER — Encounter: Payer: Self-pay | Admitting: Family Medicine

## 2021-02-21 VITALS — BP 140/78 | HR 83 | Temp 97.0°F | Ht 66.0 in | Wt 170.1 lb

## 2021-02-21 DIAGNOSIS — L270 Generalized skin eruption due to drugs and medicaments taken internally: Secondary | ICD-10-CM | POA: Insufficient documentation

## 2021-02-21 DIAGNOSIS — N3001 Acute cystitis with hematuria: Secondary | ICD-10-CM

## 2021-02-21 DIAGNOSIS — M545 Low back pain, unspecified: Secondary | ICD-10-CM | POA: Insufficient documentation

## 2021-02-21 DIAGNOSIS — N390 Urinary tract infection, site not specified: Secondary | ICD-10-CM | POA: Insufficient documentation

## 2021-02-21 LAB — POC URINALSYSI DIPSTICK (AUTOMATED)
Bilirubin, UA: NEGATIVE
Blood, UA: NEGATIVE
Glucose, UA: NEGATIVE
Ketones, UA: NEGATIVE
Leukocytes, UA: NEGATIVE
Nitrite, UA: NEGATIVE
Protein, UA: NEGATIVE
Spec Grav, UA: 1.025 (ref 1.010–1.025)
Urobilinogen, UA: 0.2 E.U./dL
pH, UA: 6 (ref 5.0–8.0)

## 2021-02-21 MED ORDER — NITROFURANTOIN MONOHYD MACRO 100 MG PO CAPS: 100.0000 mg | ORAL_CAPSULE | Freq: Two times a day (BID) | ORAL | 0 refills | Status: DC

## 2021-02-21 NOTE — Progress Notes (Signed)
Subjective:    Patient ID: Claudia Carter, female    DOB: 12/31/1965, 55 y.o.   MRN: [id number]  This visit occurred during the SARS-CoV-2 public health emergency.  Safety protocols were in place, including screening questions prior to the visit, additional usage of staff PPE, and extensive cleaning of exam room while observing appropriate contact time as indicated for disinfecting solutions.    HPI Pt presents for right lower back pain and rash after taking abx  Wt Readings from Last 3 Encounters:  02/21/21 170 lb 1 oz (77.1 kg)  05/16/20 166 lb 1.6 oz (75.3 kg)  05/05/19 182 lb 6 oz (82.7 kg)   27.45 kg/m  Pt did a video visit outside our system for uti symptoms on 4/4 She had frequency and dysuria at that time that is improved Blood in urine also  Drinking a lot of water  R lower back pain started yesterday evening on R side  Worse with movement / but still aches when not moving   Was px bactrim   Developed a rash this am  Arms broke out  Not itchy  Some on her back (not chest)  None on scalp or face   No sob or tongue swelling   Results for orders placed or performed in visit on 02/21/21  POCT Urinalysis Dipstick (Automated)  Result Value Ref Range   Color, UA Light Yellow    Clarity, UA Clear    Glucose, UA Negative Negative   Bilirubin, UA Negative    Ketones, UA Negative    Spec Grav, UA 1.025 1.010 - 1.025   Blood, UA Negative    pH, UA 6.0 5.0 - 8.0   Protein, UA Negative Negative   Urobilinogen, UA 0.2 0.2 or 1.0 E.U./dL   Nitrite, UA Negative    Leukocytes, UA Negative Negative    Patient Active Problem List   Diagnosis Date Noted  . Right low back pain 02/21/2021  . UTI (urinary tract infection) 02/21/2021  . Allergic drug rash 02/21/2021  . Somnolence 01/18/2019  . Snoring 01/18/2019  . Screening mammogram, encounter for 12/20/2016  . Colon cancer screening 09/06/2015  . Encounter for routine gynecological examination 10/11/2013  .  Routine general medical examination at a health care facility 11/26/2011  . Hyperlipidemia 12/08/2008  . Smoker 11/04/2007  . FIBROCYSTIC BREAST DISEASE 10/27/2007  . ECZEMA 10/27/2007   Past Medical History:  Diagnosis Date  . Allergy   . Anxiety   . Eczema   . Fibrocystic breast   . Hyperlipidemia   . Pneumonia   . Seizures ([place])    per pt had 1 seizure at age 89 and 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 maw  . Tobacco abuse    quit 2011   Past Surgical History:  Procedure Laterality Date  . BREAST CYST EXCISION    . pneumonia    . TONSILLECTOMY    . TUBAL LIGATION     Social History   Tobacco Use  . Smoking status: Current Every Day Smoker    Packs/day: 1.00    Types: Cigarettes  . Smokeless tobacco: Current User  . Tobacco comment: As of 04/08/2019, smokes 1 pack/day  Substance Use Topics  . Alcohol use: Yes    Alcohol/week: 0.0 standard drinks    Comment: occ  . Drug use: No   Family History  Problem Relation Age of Onset  . Diabetes Mother   . Hypertension Mother   . Ovarian cysts Mother   . Hypertension  Father   . Asthma Father   . Cancer Cousin        melanoma  . Migraines Sister   . Cancer Maternal Grandmother        breast  . Cancer Paternal Grandfather        lung  . Colon cancer Neg Hx   . Esophageal cancer Neg Hx   . Stomach cancer Neg Hx   . Rectal cancer Neg Hx    Allergies  Allergen Reactions  . Adhesive [Tape]     Per the pt , "when I were tape for a long time, days, it breaks out my skin". maw  . Neomycin-Bacitracin Zn-Polymyx     REACTION: rash  . Penicillins     REACTION: rash  . Sulfamethoxazole-Trimethoprim Rash   Current Outpatient Medications on File Prior to Visit  Medication Sig Dispense Refill  . rosuvastatin (CRESTOR) 5 MG tablet Take 1 tablet (5 mg total) by mouth daily. 90 tablet 3   No current facility-administered medications on file prior to visit.    Review of Systems  Constitutional: Negative for activity change, appetite  change, fatigue, fever and unexpected weight change.  HENT: Negative for congestion, ear pain, rhinorrhea, sinus pressure and sore throat.   Eyes: Negative for pain, redness and visual disturbance.  Respiratory: Negative for cough, shortness of breath and wheezing.   Cardiovascular: Negative for chest pain and palpitations.  Gastrointestinal: Negative for abdominal pain, blood in stool, constipation and diarrhea.  Endocrine: Negative for polydipsia and polyuria.  Genitourinary: Positive for dysuria, flank pain and hematuria. Negative for decreased urine volume, frequency, pelvic pain and urgency.  Musculoskeletal: Positive for back pain. Negative for arthralgias and myalgias.  Skin: Positive for rash. Negative for pallor.  Allergic/Immunologic: Negative for environmental allergies.  Neurological: Negative for dizziness, syncope and headaches.  Hematological: Negative for adenopathy. Does not bruise/bleed easily.  Psychiatric/Behavioral: Negative for decreased concentration and dysphoric mood. The patient is not nervous/anxious.        Objective:   Physical Exam Constitutional:      General: She is not in acute distress.    Appearance: Normal appearance. She is well-developed and normal weight.  HENT:     Head: Normocephalic and atraumatic.     Mouth/Throat:     Comments: No swelling of face/mouth Eyes:     Conjunctiva/sclera: Conjunctivae normal.     Pupils: Pupils are equal, round, and reactive to light.  Cardiovascular:     Rate and Rhythm: Normal rate and regular rhythm.     Heart sounds: Normal heart sounds.  Pulmonary:     Effort: Pulmonary effort is normal.     Breath sounds: Normal breath sounds.  Abdominal:     General: Bowel sounds are normal. There is no distension.     Palpations: Abdomen is soft.     Tenderness: There is abdominal tenderness. There is no rebound.     Comments: R cva tenderness (mild) No suprapubic tenderness or fullness    Musculoskeletal:      Cervical back: Normal range of motion and neck supple.  Lymphadenopathy:     Cervical: No cervical adenopathy.  Skin:    Findings: No rash.     Comments: Erythematous (faint) macular rash on forearms and mid back  No whelps  No excoriation    Neurological:     Mental Status: She is alert.  Psychiatric:        Mood and Affect: Mood normal.  Assessment & Plan:   Problem List Items Addressed This Visit      Musculoskeletal and Integument   Allergic drug rash    Strongly suspect rash on arms and back is from bactrim allergy Has stopped the drug Benadryl/zyrtec prn  No s/s of anaphylaxis (will watch for) ER parameters discussed  Update if not starting to improve in a week or if worsening          Genitourinary   UTI (urinary tract infection) - Primary    Originally with dysuria and blood  Improved with several days of bactrim but now has a rash  Some R flank pain (ua is clear today) Changed abx to macrobid bid  inst to keep updated re: rash and flank pain  cx pending (likely neg due to a day of abx) Enc water intake inst to call if worse in any way or blood in urine returns       Relevant Medications   nitrofurantoin, macrocrystal-monohydrate, (MACROBID) 100 MG capsule   Other Relevant Orders   Urine Culture     Other   Right low back pain    Suspect from uti  Have changed abx after all rxn to bactrim and continue to follow  inst to call if worse or not improving  ua clear/no blood today so suspect not renal stone      Relevant Orders   POCT Urinalysis Dipstick (Automated) (Completed)

## 2021-02-21 NOTE — Patient Instructions (Addendum)
Drink lots of water   Urine is clear - I still want a culture  We will call when the culture returns  Stop the bactrim (keep [place] updated re: rash)  You can take an antihistamine like zyrtec for rash/itch  Benadryl is ok also  Keep cool   Take macrobid as directed   Use a warm compress on your back (10 minutes at a time)  Tylenol is ok also  If severe - please let us know

## 2021-02-21 NOTE — Assessment & Plan Note (Signed)
Originally with dysuria and blood  Improved with several days of bactrim but now has a rash  Some R flank pain (ua is clear today) Changed abx to macrobid bid  inst to keep updated re: rash and flank pain  cx pending (likely neg due to a day of abx) Enc water intake inst to call if worse in any way or blood in urine returns

## 2021-02-21 NOTE — Assessment & Plan Note (Signed)
Strongly suspect rash on arms and back is from bactrim allergy Has stopped the drug Benadryl/zyrtec prn  No s/s of anaphylaxis (will watch for) ER parameters discussed  Update if not starting to improve in a week or if worsening

## 2021-02-21 NOTE — Telephone Encounter (Signed)
Pt said 2 days ago had video visit with [facility] with a NP; pt was started on sulfamethoxazole-tmp DS on 02/19/21 taking med bid q 12 hr. Pt started with rash on forearm on both arms about 15 mins ago. No difficulty breathing and no swelling in lips, mouth, tongue or throat. Last took med 02/21/21 at 8:30 AM. Added med to list of allergies and advised pt to stop taking the medication. Pt is still having rt lower back pain but the burning and pain upon urination has stopped. Dr Glori Bickers will see pt today at 12:30. UC & ED precautions given and pt voiced understanding.

## 2021-02-21 NOTE — Assessment & Plan Note (Signed)
Suspect from uti  Have changed abx after all rxn to bactrim and continue to follow  inst to call if worse or not improving  ua clear/no blood today so suspect not renal stone

## 2021-02-22 ENCOUNTER — Ambulatory Visit: Payer: BC Managed Care – PPO | Admitting: Family Medicine

## 2021-02-22 LAB — URINE CULTURE
MICRO NUMBER:: 11738384
Result:: NO GROWTH
SPECIMEN QUALITY:: ADEQUATE

## 2021-02-22 NOTE — Telephone Encounter (Signed)
Pt saw Dr Glori Bickers on 02/21/21.

## 2021-04-17 ENCOUNTER — Telehealth (INDEPENDENT_AMBULATORY_CARE_PROVIDER_SITE_OTHER): Payer: BC Managed Care – PPO | Admitting: Family Medicine

## 2021-04-17 ENCOUNTER — Encounter: Payer: Self-pay | Admitting: Family Medicine

## 2021-04-17 ENCOUNTER — Other Ambulatory Visit: Payer: Self-pay

## 2021-04-17 DIAGNOSIS — K5792 Diverticulitis of intestine, part unspecified, without perforation or abscess without bleeding: Secondary | ICD-10-CM | POA: Diagnosis not present

## 2021-04-17 MED ORDER — CIPROFLOXACIN HCL 500 MG PO TABS: 500.0000 mg | ORAL_TABLET | Freq: Two times a day (BID) | ORAL | 0 refills | Status: DC

## 2021-04-17 MED ORDER — METRONIDAZOLE 500 MG PO TABS: 500.0000 mg | ORAL_TABLET | Freq: Three times a day (TID) | ORAL | 0 refills | Status: AC

## 2021-04-17 NOTE — Assessment & Plan Note (Signed)
Pt has had this before and her symptoms are similar with abd pain /tenderness and low grade fever No idea what triggered it  No signs of acute abd per pt  Px cipro and flagyl (since she is pcn allergic) to take as directed Disc cipro side eff to watch for (muscle/tendon pain)  Clear fluids today-adv diet slowly  Avoid nuts and seeds  Add more fiber in the future to prevent  Rev ER parameters  inst to alert [place] if not significant imp in several days (would need to see in the office and do lab/possible CT) Due to fever, she will take home covid test as well (susupect it will be negative)

## 2021-04-17 NOTE — Progress Notes (Signed)
Virtual Visit via Video Note  I connected with Claudia Carter on 04/17/21 at 10:30 AM EDT by a video enabled telemedicine application and verified that I am speaking with the correct person using two identifiers.  Location: Patient: home Provider: office   I discussed the limitations of evaluation and management by telemedicine and the availability of in person appointments. The patient expressed understanding and agreed to proceed.  Parties involved in encounter  Patient: Claudia Carter  Provider:  Loura Pardon MD   History of Present Illness: Pt presents with c/o fever and chills and abd pain   abd pain-over the weekend [place] camping with family  Felt constipated/uncomfortable (no convenient toilet)  Worse on Sunday followed by some pain  Same symptoms as the past with diverticulitis   When she stands or sits- feels like a pulling sensation then pain in lower abdomen  Some discomfort when pressing on Left lower abd  Fever started yesterday  She gets flushed/rash with fever  ? Another diverticulitis flare   Able to move bowels now- but uncomfortable to do so  No diarrhea and no blood   No n/v  No exp to covid   No resp symptoms   Diet- no nuts or seeds that she knows of  No popcorn  No strawberries  Ate more cheese during vacation    When not traveling- more fruit/veg  Drinks a lot of water   Allergic to pcn (rash)  Temp is low grade (oral thermometer) 99.5  Feels chilled  Taking tylenol   Patient Active Problem List   Diagnosis Date Noted  . Right low back pain 02/21/2021  . UTI (urinary tract infection) 02/21/2021  . Allergic drug rash 02/21/2021  . Somnolence 01/18/2019  . Snoring 01/18/2019  . Screening mammogram, encounter for 12/20/2016  . Colon cancer screening 09/06/2015  . Encounter for routine gynecological examination 10/11/2013  . Routine general medical examination at a health care facility 11/26/2011  . Hyperlipidemia 12/08/2008  .  Smoker 11/04/2007  . FIBROCYSTIC BREAST DISEASE 10/27/2007  . ECZEMA 10/27/2007   Past Medical History:  Diagnosis Date  . Allergy   . Anxiety   . Eczema   . Fibrocystic breast   . Hyperlipidemia   . Pneumonia   . Seizures ([place])    per pt had 1 seizure at age 87 and 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 maw  . Tobacco abuse    quit 2011   Past Surgical History:  Procedure Laterality Date  . BREAST CYST EXCISION    . pneumonia    . TONSILLECTOMY    . TUBAL LIGATION     Social History   Tobacco Use  . Smoking status: Current Every Day Smoker    Packs/day: 1.00    Types: Cigarettes  . Smokeless tobacco: Current User  . Tobacco comment: As of 04/08/2019, smokes 1 pack/day  Substance Use Topics  . Alcohol use: Yes    Alcohol/week: 0.0 standard drinks    Comment: occ  . Drug use: No   Family History  Problem Relation Age of Onset  . Diabetes Mother   . Hypertension Mother   . Ovarian cysts Mother   . Hypertension Father   . Asthma Father   . Cancer Cousin        melanoma  . Migraines Sister   . Cancer Maternal Grandmother        breast  . Cancer Paternal Grandfather        lung  . Colon cancer  Neg Hx   . Esophageal cancer Neg Hx   . Stomach cancer Neg Hx   . Rectal cancer Neg Hx    Allergies  Allergen Reactions  . Adhesive [Tape]     Per the pt , "when I were tape for a long time, days, it breaks out my skin". maw  . Neomycin-Bacitracin Zn-Polymyx     REACTION: rash  . Penicillins     REACTION: rash  . Sulfamethoxazole-Trimethoprim Rash   Current Outpatient Medications on File Prior to Visit  Medication Sig Dispense Refill  . rosuvastatin (CRESTOR) 5 MG tablet Take 1 tablet (5 mg total) by mouth daily. 90 tablet 3   No current facility-administered medications on file prior to visit.   Review of Systems  Constitutional: Positive for chills, fever and malaise/fatigue.  HENT: Negative for congestion, ear pain, sinus pain and sore throat.   Eyes: Negative for  blurred vision, discharge and redness.  Respiratory: Negative for cough, shortness of breath and stridor.   Cardiovascular: Negative for chest pain, palpitations and leg swelling.  Gastrointestinal: Positive for abdominal pain. Negative for blood in stool, constipation, diarrhea, melena, nausea and vomiting.  Musculoskeletal: Negative for myalgias.  Skin: Negative for rash.  Neurological: Negative for dizziness and headaches.    Observations/Objective: Patient appears well, in no distress Weight is baseline  No facial swelling or asymmetry Normal voice-not hoarse and no slurred speech No obvious tremor or mobility impairment Moving neck and UEs normally Able to hear the call well  No cough or shortness of breath during interview  When pressing on abdomen herself, pt notes pain in the LLQ of abdomen without rebound tenderness Talkative and mentally sharp with no cognitive changes No skin changes on face or neck , no rash or pallor Affect is normal    Assessment and Plan: Problem List Items Addressed This Visit      Other   Diverticulitis - Primary    Pt has had this before and her symptoms are similar with abd pain /tenderness and low grade fever No idea what triggered it  No signs of acute abd per pt  Px cipro and flagyl (since she is pcn allergic) to take as directed Disc cipro side eff to watch for (muscle/tendon pain)  Clear fluids today-adv diet slowly  Avoid nuts and seeds  Add more fiber in the future to prevent  Rev ER parameters  inst to alert [place] if not significant imp in several days (would need to see in the office and do lab/possible CT) Due to fever, she will take home covid test as well (susupect it will be negative)          Follow Up Instructions: Do a home covid test and let us know if it comes up positive Start the cipro and flagyl as px  If side effects like muscle pain let us know Watch temp  Clear liquid diet today and gradually advance to bland  foods in 1-2 days if improving If no improvement or worse in several days please let us know asap If abdominal pain changes or worsens, call  If severe-go to the ER   I discussed the assessment and treatment plan with the patient. The patient was provided an opportunity to ask questions and all were answered. The patient agreed with the plan and demonstrated an understanding of the instructions.   The patient was advised to call back or seek an in-person evaluation if the symptoms worsen or if the condition fails  to improve as anticipated.     Loura Pardon, MD

## 2021-04-17 NOTE — Patient Instructions (Signed)
Do a home covid test and let us know if it comes up positive Start the cipro and flagyl as px  If side effects like muscle pain let us know Watch temp  Clear liquid diet today and gradually advance to bland foods in 1-2 days if improving If no improvement or worse in several days please let us know asap If abdominal pain changes or worsens, call  If severe-go to the ER

## 2021-04-18 NOTE — Telephone Encounter (Signed)
Dr. Glori Bickers sent me a message saying:  Please see if pt could drop off a urine sample (she has had a fever from diverticulitis- not covid) and can come in the building  Tell her to start the flagyl and not cipro  Thanks      Called pt and she can drop off a urine sample tomorrow, I advise pt regarding medications instructions also.  Pt added to lab appt for tomorrow so we can check urine

## 2021-04-18 NOTE — Telephone Encounter (Signed)
Pt left v/m at 8:12 this morning; tried to call pt to see if any thing else needed since also corresponding by my chart but unable to reach pt by phone. It appears pts questions answered by Dr Glori Bickers via my chart.

## 2021-04-19 ENCOUNTER — Other Ambulatory Visit: Payer: Self-pay

## 2021-04-19 ENCOUNTER — Telehealth: Payer: Self-pay | Admitting: Family Medicine

## 2021-04-19 ENCOUNTER — Other Ambulatory Visit (INDEPENDENT_AMBULATORY_CARE_PROVIDER_SITE_OTHER): Payer: BC Managed Care – PPO

## 2021-04-19 DIAGNOSIS — R300 Dysuria: Secondary | ICD-10-CM

## 2021-04-19 DIAGNOSIS — R8290 Unspecified abnormal findings in urine: Secondary | ICD-10-CM | POA: Diagnosis not present

## 2021-04-19 LAB — POC URINALSYSI DIPSTICK (AUTOMATED)
Bilirubin, UA: NEGATIVE
Blood, UA: NEGATIVE
Glucose, UA: NEGATIVE
Ketones, UA: 5
Nitrite, UA: NEGATIVE
Protein, UA: NEGATIVE
Spec Grav, UA: 1.025 (ref 1.010–1.025)
Urobilinogen, UA: 0.2 E.U./dL
pH, UA: 6 (ref 5.0–8.0)

## 2021-04-19 MED ORDER — NITROFURANTOIN MONOHYD MACRO 100 MG PO CAPS: 100.0000 mg | ORAL_CAPSULE | Freq: Two times a day (BID) | ORAL | 0 refills | Status: DC

## 2021-04-19 NOTE — Telephone Encounter (Signed)
Small amt of wbc in urine I sent in macrobid to start  Pending culture Drink lots of fluids  Please ask how her diverticular symptoms are as well

## 2021-04-19 NOTE — Telephone Encounter (Signed)
Called pt and no answer and pt's VM box is full 

## 2021-04-20 LAB — URINE CULTURE
MICRO NUMBER:: 11961363
Result:: NO GROWTH
SPECIMEN QUALITY:: ADEQUATE

## 2021-04-20 NOTE — Telephone Encounter (Signed)
Attempted to call patient but no answer and VM is full.

## 2021-04-23 NOTE — Telephone Encounter (Signed)
Urine cx was negative, pt viewed results an Dr. Marliss Coots comments regarding urine cx and that pt can stop abx

## 2021-05-07 ENCOUNTER — Ambulatory Visit (INDEPENDENT_AMBULATORY_CARE_PROVIDER_SITE_OTHER): Payer: BC Managed Care – PPO | Admitting: Family Medicine

## 2021-05-07 ENCOUNTER — Encounter: Payer: Self-pay | Admitting: Family Medicine

## 2021-05-07 ENCOUNTER — Other Ambulatory Visit: Payer: Self-pay

## 2021-05-07 VITALS — BP 128/76 | HR 81 | Temp 97.4°F | Ht 66.0 in | Wt 169.0 lb

## 2021-05-07 DIAGNOSIS — N6019 Diffuse cystic mastopathy of unspecified breast: Secondary | ICD-10-CM

## 2021-05-07 DIAGNOSIS — N6452 Nipple discharge: Secondary | ICD-10-CM | POA: Insufficient documentation

## 2021-05-07 NOTE — Patient Instructions (Signed)
Take care of yourself   I ordered a diagnostic mammogram and ultrasound at the breast center   If your symptoms worsen - or anything new please let us know

## 2021-05-07 NOTE — Assessment & Plan Note (Signed)
One episode of brownish nipple d/c Unable to express any today  In setting of old lumpectomy from cyst in that breast in the past (with similar nipple d/c)  Nl breast exam (noted bruise on L breast from recent injury moving furniture)  No M noted  H/o fibrocystic breasts with L bx (b9) in 2020  Overdue for screening as well  inst to update if clinical change Ordered bilat [place] and [facility]

## 2021-05-07 NOTE — Progress Notes (Signed)
Subjective:    Patient ID: Claudia Carter, female    DOB: 1966-04-04, 55 y.o.   MRN: [id number]  This visit occurred during the SARS-CoV-2 public health emergency.  Safety protocols were in place, including screening questions prior to the visit, additional usage of staff PPE, and extensive cleaning of exam room while observing appropriate contact time as indicated for disinfecting solutions.   HPI Pr presents for a breast check    Wt Readings from Last 3 Encounters:  05/07/21 169 lb (76.7 kg)  02/21/21 170 lb 1 oz (77.1 kg)  05/16/20 166 lb 1.6 oz (75.3 kg)   27.28 kg/m    She has a h/o fibrocystic breast change   Last mammogram was 8/20  Report as follows: from the breast center GI IMPRESSION: Left breast upper outer quadrant large group of indeterminate calcifications.   RECOMMENDATION: Given the mostly benign morphology of these calcifications, only 1 stereotactic core needle biopsy is recommended. A potential biopsy site containing the most pleomorphic forms is marked on the magnification CC view. If the pathology results are benign no further specific imaging follow-up is recommended. Otherwise, a stereotactic core needle biopsy of the anterior and posterior extends of the group, or contrast-enhanced breast MRI, is recommended to determine extend of disease.  She had the biopsy  Fibrocystic change was confirmed, no malignancy   Smoking 1ppd   Fam history MGM had breast cancer   R breast has a cyst baseline  Blocked milk duct  Woke up with brownish dc the other am again   Never had biopsy in the right breast  She had trouble breastfeeding her kids  Would get mastitis and the R breast feed from that side  Had a lumpectomy in that breast when 55 yo for a cyst that caused nipple d/c   Has not had period since 49  One -then bx on cervix was fine   Patient Active Problem List   Diagnosis Date Noted   Discharge from right nipple 05/07/2021   Right  low back pain 02/21/2021   Allergic drug rash 02/21/2021   Somnolence 01/18/2019   Snoring 01/18/2019   Diverticulitis 01/05/2019   Screening mammogram, encounter for 12/20/2016   Colon cancer screening 09/06/2015   Encounter for routine gynecological examination 10/11/2013   Routine general medical examination at a health care facility 11/26/2011   Hyperlipidemia 12/08/2008   Smoker 11/04/2007   FIBROCYSTIC BREAST DISEASE 10/27/2007   ECZEMA 10/27/2007   Past Medical History:  Diagnosis Date   Allergy    Anxiety    Eczema    Fibrocystic breast    Hyperlipidemia    Pneumonia    Seizures ([place])    per pt had 1 seizure at age 67 and 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 maw   Tobacco abuse    quit 2011   Past Surgical History:  Procedure Laterality Date   BREAST CYST EXCISION     pneumonia     TONSILLECTOMY     TUBAL LIGATION     Social History   Tobacco Use   Smoking status: Every Day    Packs/day: 1.00    Pack years: 0.00    Types: Cigarettes   Smokeless tobacco: Current   Tobacco comments:    As of 04/08/2019, smokes 1 pack/day  Substance Use Topics   Alcohol use: Yes    Alcohol/week: 0.0 standard drinks    Comment: occ   Drug use: No   Family History  Problem Relation Age of  Onset   Diabetes Mother    Hypertension Mother    Ovarian cysts Mother    Hypertension Father    Asthma Father    Cancer Cousin        melanoma   Migraines Sister    Cancer Maternal Grandmother        breast   Cancer Paternal Grandfather        lung   Colon cancer Neg Hx    Esophageal cancer Neg Hx    Stomach cancer Neg Hx    Rectal cancer Neg Hx    Allergies  Allergen Reactions   Adhesive [Tape]     Per the pt , "when I were tape for a long time, days, it breaks out my skin". maw   Neomycin-Bacitracin Zn-Polymyx     REACTION: rash   Penicillins     REACTION: rash   Sulfamethoxazole-Trimethoprim Rash   Current Outpatient Medications on File Prior to Visit  Medication Sig Dispense  Refill   rosuvastatin (CRESTOR) 5 MG tablet Take 1 tablet (5 mg total) by mouth daily. 90 tablet 3   No current facility-administered medications on file prior to visit.    Review of Systems  Constitutional:  Negative for activity change, appetite change, fatigue, fever and unexpected weight change.  HENT:  Negative for congestion, ear pain, rhinorrhea, sinus pressure and sore throat.   Eyes:  Negative for pain, redness and visual disturbance.  Respiratory:  Negative for cough, shortness of breath and wheezing.   Cardiovascular:  Negative for chest pain and palpitations.  Gastrointestinal:  Negative for abdominal pain, blood in stool, constipation and diarrhea.  Endocrine: Negative for polydipsia and polyuria.  Genitourinary:  Negative for dysuria, frequency and urgency.  Musculoskeletal:  Negative for arthralgias, back pain and myalgias.  Skin:  Negative for pallor and rash.  Allergic/Immunologic: Negative for environmental allergies.  Neurological:  Negative for dizziness, syncope and headaches.  Hematological:  Negative for adenopathy. Does not bruise/bleed easily.  Psychiatric/Behavioral:  Negative for decreased concentration and dysphoric mood. The patient is not nervous/anxious.       Objective:   Physical Exam Constitutional:      General: She is not in acute distress.    Appearance: Normal appearance. She is normal weight. She is not ill-appearing.  Eyes:     General:        Right eye: No discharge.        Left eye: No discharge.     Conjunctiva/sclera: Conjunctivae normal.     Pupils: Pupils are equal, round, and reactive to light.  Cardiovascular:     Rate and Rhythm: Normal rate and regular rhythm.     Heart sounds: Normal heart sounds.  Pulmonary:     Effort: Pulmonary effort is normal. No respiratory distress.     Breath sounds: Normal breath sounds. No wheezing.  Abdominal:     General: Abdomen is flat. There is no distension.     Tenderness: There is no  abdominal tenderness.  Genitourinary:    Comments: Breast exam: No mass, nodules, thickening, tenderness, bulging, retraction, inflamation, nipple discharge or skin changes noted.  No axillary or clavicular LA.    Old ecchymosis on upper L breast No nipple d/c either side Musculoskeletal:     Cervical back: Normal range of motion and neck supple.  Lymphadenopathy:     Cervical: No cervical adenopathy.  Neurological:     Mental Status: She is alert.     Cranial Nerves: No cranial nerve  deficit.  Psychiatric:        Mood and Affect: Mood normal.          Assessment & Plan:   Problem List Items Addressed This Visit       Other   FIBROCYSTIC BREAST DISEASE   Relevant Orders   US BREAST LTD UNI RIGHT INC AXILLA   US BREAST LTD UNI LEFT INC AXILLA   MM DIAG BREAST TOMO BILATERAL   Discharge from right nipple - Primary    One episode of brownish nipple d/c Unable to express any today  In setting of old lumpectomy from cyst in that breast in the past (with similar nipple d/c)  Nl breast exam (noted bruise on L breast from recent injury moving furniture)  No M noted  H/o fibrocystic breasts with L bx (b9) in 2020  Overdue for screening as well  inst to update if clinical change Ordered bilat [place] and [facility]       Relevant Orders   US BREAST LTD UNI RIGHT INC AXILLA   US BREAST LTD UNI LEFT INC AXILLA   MM DIAG BREAST TOMO BILATERAL

## 2021-06-20 ENCOUNTER — Other Ambulatory Visit: Payer: BC Managed Care – PPO

## 2021-06-20 ENCOUNTER — Ambulatory Visit
Admission: RE | Admit: 2021-06-20 | Discharge: 2021-06-20 | Disposition: A | Discharge status: Home / Self Care | Payer: BC Managed Care – PPO | Source: Ambulatory Visit | Attending: Family Medicine | Admitting: Family Medicine

## 2021-06-20 ENCOUNTER — Other Ambulatory Visit: Payer: Self-pay

## 2021-06-20 DIAGNOSIS — N6452 Nipple discharge: Secondary | ICD-10-CM | POA: Diagnosis not present

## 2021-06-20 DIAGNOSIS — N6019 Diffuse cystic mastopathy of unspecified breast: Secondary | ICD-10-CM

## 2021-06-20 DIAGNOSIS — R922 Inconclusive mammogram: Secondary | ICD-10-CM | POA: Diagnosis not present

## 2021-06-27 ENCOUNTER — Other Ambulatory Visit: Payer: Self-pay | Admitting: Family Medicine

## 2021-06-27 NOTE — Telephone Encounter (Signed)
Pt's had multiple recent acute appts but no recent or future f/u or CPE appts. Last CPE was on 05/16/20 and that's when last lipid lab was done also

## 2021-06-27 NOTE — Telephone Encounter (Signed)
Please schedule PE and refill until then  

## 2021-06-28 NOTE — Telephone Encounter (Addendum)
Called pt to schedule appt. VM is full

## 2021-06-28 NOTE — Telephone Encounter (Signed)
Crestor refilled once and will route to support pool to get appt scheduled

## 2021-07-02 ENCOUNTER — Ambulatory Visit: Payer: BC Managed Care – PPO | Admitting: Family Medicine

## 2021-09-26 ENCOUNTER — Other Ambulatory Visit: Payer: Self-pay | Admitting: Family Medicine

## 2021-12-25 ENCOUNTER — Other Ambulatory Visit: Payer: Self-pay | Admitting: Family Medicine

## 2021-12-25 NOTE — Telephone Encounter (Signed)
Pt is already scheduled

## 2021-12-25 NOTE — Telephone Encounter (Signed)
No cholesterol labs since 2021, looks like CPE was cancelled this year. Please call pt and schedule CPE with labs prior and then route back to me to refill

## 2022-01-27 ENCOUNTER — Telehealth: Payer: Self-pay | Admitting: Family Medicine

## 2022-01-27 DIAGNOSIS — E7800 Pure hypercholesterolemia, unspecified: Secondary | ICD-10-CM

## 2022-01-27 DIAGNOSIS — Z0000 Encounter for general adult medical examination without abnormal findings: Secondary | ICD-10-CM

## 2022-01-27 NOTE — Telephone Encounter (Signed)
-----   Message from Ellamae Sia sent at 01/16/2022  3:51 PM EST ----- ?Regarding: lab orders for Monday, 3.13.23 ?Patient is scheduled for CPX labs, please order future labs, Thanks , Terri ? ? ?

## 2022-01-28 ENCOUNTER — Other Ambulatory Visit (INDEPENDENT_AMBULATORY_CARE_PROVIDER_SITE_OTHER): Payer: BC Managed Care – PPO

## 2022-01-28 ENCOUNTER — Other Ambulatory Visit: Payer: Self-pay

## 2022-01-28 DIAGNOSIS — Z0000 Encounter for general adult medical examination without abnormal findings: Secondary | ICD-10-CM

## 2022-01-28 DIAGNOSIS — E7800 Pure hypercholesterolemia, unspecified: Secondary | ICD-10-CM | POA: Diagnosis not present

## 2022-01-28 LAB — COMPREHENSIVE METABOLIC PANEL
ALT: 13 U/L (ref 0–35)
AST: 14 U/L (ref 0–37)
Albumin: 4.8 g/dL (ref 3.5–5.2)
Alkaline Phosphatase: 71 U/L (ref 39–117)
BUN: 15 mg/dL (ref 6–23)
CO2: 28 mEq/L (ref 19–32)
Calcium: 9.8 mg/dL (ref 8.4–10.5)
Chloride: 103 mEq/L (ref 96–112)
Creatinine, Ser: 0.82 mg/dL (ref 0.40–1.20)
GFR: 79.8 mL/min (ref >60.00)
Glucose, Bld: 97 mg/dL (ref 70–99)
Potassium: 4 mEq/L (ref 3.5–5.1)
Sodium: 140 mEq/L (ref 135–145)
Total Bilirubin: 0.7 mg/dL (ref 0.2–1.2)
Total Protein: 6.7 g/dL (ref 6.0–8.3)

## 2022-01-28 LAB — CBC WITH DIFFERENTIAL/PLATELET
Basophils Absolute: 0 10*3/uL (ref 0.0–0.1)
Basophils Relative: 0.5 % (ref 0.0–3.0)
Eosinophils Absolute: 0.1 10*3/uL (ref 0.0–0.7)
Eosinophils Relative: 3.2 % (ref 0.0–5.0)
HCT: 40.1 % (ref 36.0–46.0)
Hemoglobin: 13.7 g/dL (ref 12.0–15.0)
Lymphocytes Relative: 43.4 % (ref 12.0–46.0)
Lymphs Abs: 1.8 10*3/uL (ref 0.7–4.0)
MCHC: 34.1 g/dL (ref 30.0–36.0)
MCV: 93.6 fl (ref 78.0–100.0)
Monocytes Absolute: 0.3 10*3/uL (ref 0.1–1.0)
Monocytes Relative: 7.4 % (ref 3.0–12.0)
Neutro Abs: 1.9 10*3/uL (ref 1.4–7.7)
Neutrophils Relative %: 45.5 % (ref 43.0–77.0)
Platelets: 196 10*3/uL (ref 150.0–400.0)
RBC: 4.29 Mil/uL (ref 3.87–5.11)
RDW: 12.1 % (ref 11.5–15.5)
WBC: 4.2 10*3/uL (ref 4.0–10.5)

## 2022-01-28 LAB — TSH: TSH: 2.55 u[IU]/mL (ref 0.35–5.50)

## 2022-01-28 LAB — LIPID PANEL
Cholesterol: 183 mg/dL (ref 0–200)
HDL: 74.2 mg/dL (ref >39.00)
LDL Cholesterol: 75 mg/dL (ref 0–99)
NonHDL: 108.66
Total CHOL/HDL Ratio: 2
Triglycerides: 166 mg/dL — ABNORMAL HIGH (ref 0.0–149.0)
VLDL: 33.2 mg/dL (ref 0.0–40.0)

## 2022-02-04 ENCOUNTER — Ambulatory Visit (INDEPENDENT_AMBULATORY_CARE_PROVIDER_SITE_OTHER): Payer: BC Managed Care – PPO | Admitting: Family Medicine

## 2022-02-04 ENCOUNTER — Other Ambulatory Visit (HOSPITAL_COMMUNITY)
Admission: RE | Admit: 2022-02-04 | Discharge: 2022-02-04 | Disposition: A | Discharge status: Home / Self Care | Payer: BC Managed Care – PPO | Source: Ambulatory Visit | Attending: Family Medicine | Admitting: Family Medicine

## 2022-02-04 ENCOUNTER — Encounter: Payer: Self-pay | Admitting: Family Medicine

## 2022-02-04 ENCOUNTER — Other Ambulatory Visit: Payer: Self-pay

## 2022-02-04 VITALS — BP 130/76 | HR 78 | Temp 98.7°F | Ht 65.0 in | Wt 169.0 lb

## 2022-02-04 DIAGNOSIS — Z1211 Encounter for screening for malignant neoplasm of colon: Secondary | ICD-10-CM | POA: Diagnosis not present

## 2022-02-04 DIAGNOSIS — Z01419 Encounter for gynecological examination (general) (routine) without abnormal findings: Secondary | ICD-10-CM | POA: Diagnosis not present

## 2022-02-04 DIAGNOSIS — E7800 Pure hypercholesterolemia, unspecified: Secondary | ICD-10-CM | POA: Diagnosis not present

## 2022-02-04 DIAGNOSIS — Z0000 Encounter for general adult medical examination without abnormal findings: Secondary | ICD-10-CM | POA: Diagnosis not present

## 2022-02-04 DIAGNOSIS — Z720 Tobacco use: Secondary | ICD-10-CM

## 2022-02-04 DIAGNOSIS — Z23 Encounter for immunization: Secondary | ICD-10-CM | POA: Diagnosis not present

## 2022-02-04 DIAGNOSIS — Z1231 Encounter for screening mammogram for malignant neoplasm of breast: Secondary | ICD-10-CM | POA: Diagnosis not present

## 2022-02-04 DIAGNOSIS — Z7289 Other problems related to lifestyle: Secondary | ICD-10-CM | POA: Insufficient documentation

## 2022-02-04 NOTE — Assessment & Plan Note (Signed)
Reviewed health habits including diet and exercise and skin cancer prevention ?Reviewed appropriate screening tests for age  ?Also reviewed health mt list, fam hx and immunization status , as well as social and family history   ?See HPI ?Labs reviewed ?Colonoscopy is up-to-date from 2017 ?Patient interested in Shingrix vaccine and plans to check on coverage ?Flu shot missed the season but she plans to get one in the fall ?Tetanus shot updated today ?GYN exam and Pap done today ?Mammogram up-to-date from 8 of 2022 in the setting of nipple discharge which is since resolved ?Patient quit smoking and is now vaping a low nicotine product, commended on this she plans to quit entirely ?

## 2022-02-04 NOTE — Assessment & Plan Note (Signed)
Commended patient for quitting cigarettes entirely ?She is using a low nicotine product and plans to work towards quitting that as well ?

## 2022-02-04 NOTE — Assessment & Plan Note (Signed)
Disc goals for lipids and reasons to control them ?Rev last labs with pt ?Rev low sat fat diet in detail ?Improved LDL at 75 and HDL at 74.20 as well as triglycerides down to 166 ?Plan to continue Crestor 5 mg daily which patient is tolerating well as well as low sat fat diet ?

## 2022-02-04 NOTE — Patient Instructions (Addendum)
If you are interested in the shingles vaccine series (Shingrix), call your insurance or pharmacy to check on coverage and location it must be given.  If affordable - you can schedule it here or at your pharmacy depending on coverage  ? ?Tetanus shot today  ? ?Take care of yourself  ? ?Avoid red meat/ fried foods/ egg yolks/ fatty breakfast meats/ butter, cheese and high fat dairy/ and shellfish   ? ?Pap pending /done today  ? ? ? ?

## 2022-02-04 NOTE — Progress Notes (Signed)
Subjective:    Patient ID: Claudia Carter, female    DOB: 24-Jun-1966, 56 y.o.   MRN: [id number]  This visit occurred during the SARS-CoV-2 public health emergency.  Safety protocols were in place, including screening questions prior to the visit, additional usage of staff PPE, and extensive cleaning of exam room while observing appropriate contact time as indicated for disinfecting solutions.   HPI Here for health maintenance exam and to review chronic medical problems    Wt Readings from Last 3 Encounters:  02/04/22 169 lb (76.7 kg)  05/07/21 169 lb (76.7 kg)  02/21/21 170 lb 1 oz (77.1 kg)   28.12 kg/m  Working and some travel  Expecting a new grand son   Needs to exercise more   Colonoscopy 12/2015  10 y recall    Zoster status : interested in shingrix  Covid immunized Flu shot : missed it this season  Tdap 11/2011   Pap 12/2016 due for 5 y  Menopausal   Not as many hot flashes No periods  Sleep is a little better    Mammogram 06/2021 (normal in setting of nipple discharge)  Self breast exam non lumps  No more nipple discharge   Smoking status : quit cig and vaping  Low nicotine  Doing better    BP Readings from Last 3 Encounters:  02/04/22 130/76  05/07/21 128/76  02/21/21 140/78   Pulse Readings from Last 3 Encounters:  02/04/22 78  05/07/21 81  02/21/21 83   Occ feels like she skips a heartbeat     Hyperlipidemia  Lab Results  Component Value Date   CHOL 183 01/28/2022   CHOL 273 (H) 04/28/2020   CHOL 166 06/25/2019   Lab Results  Component Value Date   HDL 74.20 01/28/2022   HDL 52.30 04/28/2020   HDL 44.90 06/25/2019   Lab Results  Component Value Date   LDLCALC 75 01/28/2022   LDLCALC 82 01/17/2012   Lab Results  Component Value Date   TRIG 166.0 (H) 01/28/2022   TRIG (H) 04/28/2020    404.0 Triglyceride is over 400; calculations on Lipids are invalid.   TRIG 201.0 (H) 06/25/2019   Lab Results  Component Value Date    CHOLHDL 2 01/28/2022   CHOLHDL 5 04/28/2020   CHOLHDL 4 06/25/2019   Lab Results  Component Value Date   LDLDIRECT 147.0 04/28/2020   LDLDIRECT 99.0 06/25/2019   LDLDIRECT 109.0 05/05/2019  Crestor 5 mg daily  Working on diet  Improved !   Patient Active Problem List   Diagnosis Date Noted   Current every day nicotine vaping 02/04/2022   Somnolence 01/18/2019   Snoring 01/18/2019   Screening mammogram, encounter for 12/20/2016   Colon cancer screening 09/06/2015   Encounter for routine gynecological examination 10/11/2013   Routine general medical examination at a health care facility 11/26/2011   Hyperlipidemia 12/08/2008   FIBROCYSTIC BREAST DISEASE 10/27/2007   ECZEMA 10/27/2007   Past Medical History:  Diagnosis Date   Allergy    Anxiety    Eczema    Fibrocystic breast    Hyperlipidemia    Pneumonia    Seizures (HCC)    per pt had 1 seizure at age 61 and 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 maw   Tobacco abuse    quit 2011   Past Surgical History:  Procedure Laterality Date   BREAST CYST EXCISION     pneumonia     TONSILLECTOMY     TUBAL LIGATION  Social History   Tobacco Use   Smoking status: Every Day    Packs/day: 1.00    Types: Cigarettes   Smokeless tobacco: Current   Tobacco comments:    As of 04/08/2019, smokes 1 pack/day  Substance Use Topics   Alcohol use: Yes    Alcohol/week: 0.0 standard drinks    Comment: occ   Drug use: No   Family History  Problem Relation Age of Onset   Diabetes Mother    Hypertension Mother    Ovarian cysts Mother    Hypertension Father    Asthma Father    Cancer Cousin        melanoma   Migraines Sister    Cancer Maternal Grandmother        breast   Cancer Paternal Grandfather        lung   Colon cancer Neg Hx    Esophageal cancer Neg Hx    Stomach cancer Neg Hx    Rectal cancer Neg Hx    Allergies  Allergen Reactions   Adhesive [Tape]     Per the pt , "when I were tape for a long time, days, it breaks  out my skin". maw   Neomycin-Bacitracin Zn-Polymyx     REACTION: rash   Penicillins     REACTION: rash   Sulfamethoxazole-Trimethoprim Rash   Current Outpatient Medications on File Prior to Visit  Medication Sig Dispense Refill   rosuvastatin (CRESTOR) 5 MG tablet TAKE 1 TABLET BY MOUTH EVERY DAY 90 tablet 0   No current facility-administered medications on file prior to visit.    Review of Systems  Constitutional:  Negative for activity change, appetite change, fatigue, fever and unexpected weight change.  HENT:  Negative for congestion, ear pain, rhinorrhea, sinus pressure and sore throat.   Eyes:  Negative for pain, redness and visual disturbance.  Respiratory:  Negative for cough, shortness of breath and wheezing.   Cardiovascular:  Negative for chest pain and palpitations.  Gastrointestinal:  Negative for abdominal pain, blood in stool, constipation and diarrhea.  Endocrine: Negative for polydipsia and polyuria.  Genitourinary:  Negative for dysuria, frequency and urgency.  Musculoskeletal:  Negative for arthralgias, back pain and myalgias.  Skin:  Negative for pallor and rash.  Allergic/Immunologic: Negative for environmental allergies.  Neurological:  Negative for dizziness, syncope and headaches.  Hematological:  Negative for adenopathy. Does not bruise/bleed easily.  Psychiatric/Behavioral:  Negative for decreased concentration and dysphoric mood. The patient is not nervous/anxious.       Objective:   Physical Exam Constitutional:      General: She is not in acute distress.    Appearance: Normal appearance. She is well-developed and normal weight. She is not ill-appearing or diaphoretic.  HENT:     Head: Normocephalic and atraumatic.     Right Ear: Tympanic membrane, ear canal and external ear normal.     Left Ear: Tympanic membrane, ear canal and external ear normal.     Nose: Nose normal. No congestion.     Mouth/Throat:     Mouth: Mucous membranes are moist.      Pharynx: Oropharynx is clear. No posterior oropharyngeal erythema.  Eyes:     General: No scleral icterus.    Extraocular Movements: Extraocular movements intact.     Conjunctiva/sclera: Conjunctivae normal.     Pupils: Pupils are equal, round, and reactive to light.  Neck:     Thyroid: No thyromegaly.     Vascular: No carotid bruit or JVD.  Cardiovascular:     Rate and Rhythm: Normal rate and regular rhythm.     Pulses: Normal pulses.     Heart sounds: Normal heart sounds.    No gallop.  Pulmonary:     Effort: Pulmonary effort is normal. No respiratory distress.     Breath sounds: Normal breath sounds. No wheezing.     Comments: Good air exch Chest:     Chest wall: No tenderness.  Abdominal:     General: Bowel sounds are normal. There is no distension or abdominal bruit.     Palpations: Abdomen is soft. There is no mass.     Tenderness: There is no abdominal tenderness.     Hernia: No hernia is present.  Genitourinary:    Comments: Breast exam: No mass, nodules, thickening, tenderness, bulging, retraction, inflamation, nipple discharge or skin changes noted.  No axillary or clavicular LA.                   Anus appears normal w/o hemorrhoids or masses       External genitalia : nl appearance and hair distribution/no lesions       Urethral meatus : nl size, no lesions or prolapse       Urethra: no masses, tenderness or scarring      Bladder : no masses or tenderness       Vagina: nl general appearance, no discharge or  Lesions, no significant cystocele  or rectocele       Cervix: no lesions/ discharge or friability      Uterus: nl size, contour, position, and mobility (not fixed) , non tender      Adnexa : no masses, tenderness, enlargement or nodularity          Musculoskeletal:        General: No tenderness. Normal range of motion.     Cervical back: Normal range of motion and neck supple. No rigidity. No muscular tenderness.     Right lower leg: No edema.     Left  lower leg: No edema.     Comments: No kyphosis   Lymphadenopathy:     Cervical: No cervical adenopathy.  Skin:    General: Skin is warm and dry.     Coloration: Skin is not pale.     Findings: No erythema or rash.     Comments: Solar lentigines diffusely Fair complexion   Neurological:     Mental Status: She is alert. Mental status is at baseline.     Cranial Nerves: No cranial nerve deficit.     Motor: No abnormal muscle tone.     Coordination: Coordination normal.     Gait: Gait normal.     Deep Tendon Reflexes: Reflexes are normal and symmetric. Reflexes normal.  Psychiatric:        Mood and Affect: Mood normal.        Cognition and Memory: Cognition and memory normal.          Assessment & Plan:   Problem List Items Addressed This Visit       Other   Colon cancer screening    Last colonoscopy was February 2017 with a 10-year recall for hyperplastic polyps      Current every day nicotine vaping    Commended patient for quitting cigarettes entirely She is using a low nicotine product and plans to work towards quitting that as well      Encounter for routine gynecological examination    For  postmenopausal female  No abnormal findings and no complaints Pap sent Encouraged calcium and vitamin D intake for bone health      Relevant Orders   Cytology - PAP([place])   Hyperlipidemia    Disc goals for lipids and reasons to control them Rev last labs with pt Rev low sat fat diet in detail Improved LDL at 75 and HDL at 74.20 as well as triglycerides down to 166 Plan to continue Crestor 5 mg daily which patient is tolerating well as well as low sat fat diet      Routine general medical examination at a health care facility - Primary    Reviewed health habits including diet and exercise and skin cancer prevention Reviewed appropriate screening tests for age  Also reviewed health mt list, fam hx and immunization status , as well as social and family history    See HPI Labs reviewed Colonoscopy is up-to-date from 2017 Patient interested in Shingrix vaccine and plans to check on coverage Flu shot missed the season but she plans to get one in the fall Tetanus shot updated today GYN exam and Pap done today Mammogram up-to-date from 8 of 2022 in the setting of nipple discharge which is since resolved Patient quit smoking and is now vaping a low nicotine product, commended on this she plans to quit entirely      Screening mammogram, encounter for

## 2022-02-04 NOTE — Assessment & Plan Note (Signed)
Last colonoscopy was February 2017 with a 10-year recall for hyperplastic polyps ?

## 2022-02-04 NOTE — Assessment & Plan Note (Signed)
For postmenopausal female ? ?No abnormal findings and no complaints ?Pap sent ?Encouraged calcium and vitamin D intake for bone health ?

## 2022-02-06 LAB — CYTOLOGY - PAP
Comment: NEGATIVE
Diagnosis: NEGATIVE
High risk HPV: NEGATIVE

## 2022-02-25 ENCOUNTER — Encounter: Payer: Self-pay | Admitting: Family Medicine

## 2022-02-26 MED ORDER — SERTRALINE HCL 25 MG PO TABS: 25.0000 mg | ORAL_TABLET | Freq: Every day | ORAL | 3 refills | Status: DC

## 2022-03-07 ENCOUNTER — Encounter: Payer: Self-pay | Admitting: Family Medicine

## 2022-03-07 ENCOUNTER — Telehealth (INDEPENDENT_AMBULATORY_CARE_PROVIDER_SITE_OTHER): Payer: BC Managed Care – PPO | Admitting: Family Medicine

## 2022-03-07 DIAGNOSIS — F32A Depression, unspecified: Secondary | ICD-10-CM | POA: Diagnosis not present

## 2022-03-07 DIAGNOSIS — F419 Anxiety disorder, unspecified: Secondary | ICD-10-CM | POA: Insufficient documentation

## 2022-03-07 MED ORDER — ALPRAZOLAM 0.5 MG PO TABS: 0.5000 mg | ORAL_TABLET | Freq: Two times a day (BID) | ORAL | 0 refills | Status: DC | PRN

## 2022-03-07 MED ORDER — VENLAFAXINE HCL ER 37.5 MG PO CP24: 37.5000 mg | ORAL_CAPSULE | Freq: Every day | ORAL | 1 refills | Status: DC

## 2022-03-07 NOTE — Assessment & Plan Note (Addendum)
Past h/o this Claudia Carter ok w/o treatment for 3 y and now primarily anxious ?Reviewed stressors/ coping techniques/symptoms/ support sources/ tx options and side effects in detail today ?Open to therapy-referral done  ?Failed sertraline the 2nd time (intensified anxiety)  ?Disc trial of effexor xr 37.5 mg daily with xanas ?Discussed expectations of SNRI medication including time to effectiveness and mechanism of action, also poss of side effects (early and late)- including mental fuzziness, weight or appetite change, nausea and poss of worse dep or anxiety (even suicidal thoughts)  Pt voiced understanding and will stop med and update if this occurs  ?Disc imp of self care including exercise/outdoor time and good sleep habits  ?Plan f/u in 2-4 weeks  ?ER precautions discussed (no SI)  ?

## 2022-03-07 NOTE — Patient Instructions (Addendum)
Start effexor xr and if any intolerable side effects or if you feel worse (depression) hold it and let us know  ?If you feel like hurting yourself please alert [place] and go to ER at [place]  ?Use xanax for severe anxiety as needed with caution of sedation and habit  ? ?Take care of yourself  ?Try to get out and walk  ? ?I will place a referral for counseling/ you should get a call  ? ?Here are some more resources if we cannot get that in a timely fashion  ? ?Here are some locations in [place] for Counseling ? ? ?-[facility]  [phone]  ?   *Loss and Grief, Life transitions, College/Graduate Students... all varieties of services in addition*  ? ?-[facility]  [phone]  ?   *Individual Counseling, Family, Marriage... EMDR, Addiction, ADHD, OCD, PTSD*  ? ?-[facility]  [phone]  (Psychiatry)  ?   *Depression, Anxiety, ADHD, Substance Abuse, Bipolar Disorder, Psychotherapy*  ? ?-[facility]  [phone]  ?   *Individual Counseling, Addiction and Alcoholism Counseling*  ? ?-[facility] [phone]  ? ?-[facility] [phone]  ?   *Individual, Adolescent, Family Counseling*  ? ?-[facility] [phone]  ?   *Individual Counseling, Addiction Counseling*  ? ?Please make and office appt in 2-4 weeks  ? ?

## 2022-03-07 NOTE — Progress Notes (Signed)
Virtual Visit via Video Note ? ?I connected with [name] on 03/07/22 at  9:00 AM EDT by a video enabled telemedicine application and verified that I am speaking with the correct person using two identifiers. ? ?Location: ?Patient: home ?Provider: office  ?  ?I discussed the limitations of evaluation and management by telemedicine and the availability of in person appointments. The patient expressed understanding and agreed to proceed. ? ?Parties involved in encounter ? ?Patient: Nykeria Mealing  ? ?Provider:  Loura Pardon MD  ? ?History of Present Illness: ?Here for anxiety/depression  ? ?Used to take sertraline  ?Messaged wanting to get back on it  ? ?Started it back at 25 mg  ?Made her feel worse /more jittery and like her scalp/skin was crawling  ? ?It had been 3 years ago -got off it /felt like it blunted emotions  ? ?Anxious symptoms: irrational /cannot get them to stop ?None of the intrusive thoughts involve hurting herself or suicide  ?Worries about this happening to her again  ?No h/o hosp  ?Makes her feel upset  ?Always trended towards OCD  ? ?Feels worry/ jittery/shaky ?Is tearful  ? 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 ?Counseling history :  ?Has never gone to a therapist as an adult  ? ? ?Feels like she needs immediate relief  ? ?No other medicines in the past  ? ?No big life changes right now  ?Work is always high stress /no change in that  (over 56 y and pays well and she worked her way up-does not want to change)  ?Always has a lot on her plate  ? ?Sleep : not sleeping well/ had one night with only 30 min of sleep (due to anxiety)  ?Appetite : not as good/is still eating  ? ?She likes to walk -not doing lately  ? ? ? ?Patient Active Problem List  ?  Diagnosis Date Noted  ? Anxiety and depression 03/07/2022  ? Current every day nicotine vaping 02/04/2022  ? Somnolence 01/18/2019  ? Snoring 01/18/2019  ? Screening mammogram, encounter for 12/20/2016  ? Colon cancer screening 09/06/2015  ? Encounter for routine gynecological examination 10/11/2013  ? Routine general medical examination at a health care facility 11/26/2011  ? Hyperlipidemia 12/08/2008  ? FIBROCYSTIC BREAST DISEASE 10/27/2007  ? ECZEMA 10/27/2007  ? ?Past Medical History:  ?Diagnosis Date  ? Allergy   ? Anxiety   ? Eczema   ? Fibrocystic breast   ? Hyperlipidemia   ? Pneumonia   ? Seizures ([place])   ? per pt had 1 seizure at age 33 and 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 maw  ? Tobacco abuse   ? quit 2011  ? ?Past Surgical History:  ?Procedure Laterality Date  ? BREAST CYST EXCISION    ? pneumonia    ? TONSILLECTOMY    ? TUBAL LIGATION    ? ?Social History  ? ?Tobacco Use  ? Smoking status: Every Day  ?  Packs/day: 1.00  ?  Types: Cigarettes  ? Smokeless tobacco: Current  ? Tobacco comments:  ?  As of 04/08/2019, smokes 1 pack/day  ?Substance Use Topics  ? Alcohol use: Yes  ?  Alcohol/week: 0.0  standard drinks  ?  Comment: occ  ? Drug use: No  ? ?Family History  ?Problem Relation Age of Onset  ? Diabetes Mother   ? Hypertension Mother   ? Ovarian cysts Mother   ? Hypertension Father   ? Asthma Father   ? Cancer Cousin   ?     melanoma  ? Migraines Sister   ? Cancer Maternal Grandmother   ?     breast  ? Cancer Paternal Grandfather   ?     lung  ? Colon cancer Neg Hx   ? Esophageal cancer Neg Hx   ? Stomach cancer Neg Hx   ? Rectal cancer Neg Hx   ? ?Allergies  ?Allergen Reactions  ? Adhesive [Tape]   ?  Per the pt , "when I were tape for a long time, days, it breaks out my skin". maw  ? Neomycin-Bacitracin Zn-Polymyx   ?  REACTION: rash  ? Penicillins   ?  REACTION: rash  ? Sulfamethoxazole-Trimethoprim Rash  ? ?Current Outpatient Medications on File Prior to Visit  ?Medication Sig Dispense Refill  ? rosuvastatin  (CRESTOR) 5 MG tablet TAKE 1 TABLET BY MOUTH EVERY DAY 90 tablet 0  ? ?No current facility-administered medications on file prior to visit.  ?  ? Review of Systems  ?Constitutional:  Positive for malaise/fatigue. Negative for chills and fever.  ?HENT:  Negative for congestion, ear pain, sinus pain and sore throat.   ?Eyes:  Negative for blurred vision, discharge and redness.  ?Respiratory:  Negative for cough, shortness of breath and stridor.   ?Cardiovascular:  Negative for chest pain, palpitations and leg swelling.  ?Gastrointestinal:  Negative for abdominal pain, diarrhea, nausea and vomiting.  ?Musculoskeletal:  Negative for myalgias.  ?Skin:  Negative for rash.  ?Neurological:  Negative for dizziness and headaches.  ?Psychiatric/Behavioral:  Positive for depression. Negative for hallucinations, memory loss, substance abuse and suicidal ideas. The patient is nervous/anxious and has insomnia.   ? ?Observations/Objective: ?Patient appears well, in no distress ?Weight is baseline  ?No facial swelling or asymmetry ?Normal voice-not hoarse and no slurred speech ?No obvious tremor or mobility impairment ?Moving neck and UEs normally ?Able to hear the call well  ?No cough or shortness of breath during interview  ?Talkative and mentally sharp with no cognitive changes ?No skin changes on face or neck , no rash or pallor ?Affect is anxious/tearful ?Candidly talks about stressors and symptoms and denies SI  ? ? ?Assessment and Plan: ?Problem List Items Addressed This Visit   ? ?  ? Other  ? Anxiety and depression  ?  Past h/o this Merdis Delay ok w/o treatment for 3 y and now primarily anxious ?Reviewed stressors/ coping techniques/symptoms/ support sources/ tx options and side effects in detail today ?Open to therapy-referral done  ?Failed sertraline the 2nd time (intensified anxiety)  ?Disc trial of effexor xr 37.5 mg daily with xanas ?Discussed expectations of SNRI medication including time to effectiveness and mechanism of  action, also poss of side effects (early and late)- including mental fuzziness, weight or appetite change, nausea and poss of worse dep or anxiety (even suicidal thoughts)  Pt voiced understanding and will stop med and update if this occurs  ?Disc imp of self care including exercise/outdoor time and good sleep habits  ?Plan f/u in 2-4 weeks  ?ER precautions discussed (no SI)  ? ?  ?  ? Relevant Medications  ? venlafaxine XR (EFFEXOR XR) 37.5 MG 24 hr capsule  ?  ALPRAZolam (XANAX) 0.5 MG tablet  ? ? ? ?Follow Up Instructions: ?Start effexor xr and if any intolerable side effects or if you feel worse (depression) hold it and let us know  ?If you feel like hurting yourself please alert [place] and go to ER at [place]  ?Use xanax for severe anxiety as needed with caution of sedation and habit  ? ?Take care of yourself  ?Try to get out and walk  ? ?I will place a referral for counseling/ you should get a call  ? ?See AVS ?  ?I discussed the assessment and treatment plan with the patient. The patient was provided an opportunity to ask questions and all were answered. The patient agreed with the plan and demonstrated an understanding of the instructions. ?  ?The patient was advised to call back or seek an in-person evaluation if the symptoms worsen or if the condition fails to improve as anticipated. ? ? ? ? ?Loura Pardon, MD ? ?

## 2022-03-29 ENCOUNTER — Other Ambulatory Visit: Payer: Self-pay | Admitting: Family Medicine

## 2022-04-05 ENCOUNTER — Ambulatory Visit (INDEPENDENT_AMBULATORY_CARE_PROVIDER_SITE_OTHER): Payer: BC Managed Care – PPO | Admitting: Psychology

## 2022-04-05 DIAGNOSIS — F4323 Adjustment disorder with mixed anxiety and depressed mood: Secondary | ICD-10-CM
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
## 2022-04-06 ENCOUNTER — Other Ambulatory Visit: Payer: Self-pay | Admitting: Family Medicine

## 2022-04-19 ENCOUNTER — Ambulatory Visit (INDEPENDENT_AMBULATORY_CARE_PROVIDER_SITE_OTHER): Payer: BC Managed Care – PPO | Admitting: Psychology

## 2022-04-19 DIAGNOSIS — F4323 Adjustment disorder with mixed anxiety and depressed mood: Secondary | ICD-10-CM | POA: Diagnosis not present
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
## 2022-05-03 ENCOUNTER — Ambulatory Visit (INDEPENDENT_AMBULATORY_CARE_PROVIDER_SITE_OTHER): Payer: BC Managed Care – PPO | Admitting: Psychology

## 2022-05-03 DIAGNOSIS — F4323 Adjustment disorder with mixed anxiety and depressed mood: Secondary | ICD-10-CM | POA: Diagnosis not present
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
## 2022-05-17 ENCOUNTER — Ambulatory Visit (INDEPENDENT_AMBULATORY_CARE_PROVIDER_SITE_OTHER): Payer: BC Managed Care – PPO | Admitting: Psychology

## 2022-05-17 DIAGNOSIS — F4323 Adjustment disorder with mixed anxiety and depressed mood: Secondary | ICD-10-CM
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
## 2022-05-20 ENCOUNTER — Encounter: Payer: Self-pay | Admitting: Family Medicine

## 2022-06-12 ENCOUNTER — Ambulatory Visit: Payer: BC Managed Care – PPO

## 2022-06-14 ENCOUNTER — Ambulatory Visit: Payer: BC Managed Care – PPO | Admitting: Psychology

## 2022-06-19 ENCOUNTER — Ambulatory Visit: Payer: BC Managed Care – PPO | Admitting: Psychology

## 2022-09-23 ENCOUNTER — Ambulatory Visit (INDEPENDENT_AMBULATORY_CARE_PROVIDER_SITE_OTHER): Payer: BC Managed Care – PPO | Admitting: Primary Care

## 2022-09-23 ENCOUNTER — Encounter: Payer: Self-pay | Admitting: Primary Care

## 2022-09-23 VITALS — BP 146/82 | HR 94 | Temp 97.6°F | Ht 65.0 in | Wt 168.0 lb

## 2022-09-23 DIAGNOSIS — J069 Acute upper respiratory infection, unspecified: Secondary | ICD-10-CM | POA: Diagnosis not present

## 2022-09-23 LAB — POCT INFLUENZA A/B
Influenza A, POC: NEGATIVE
Influenza B, POC: NEGATIVE

## 2022-09-23 LAB — POC COVID19 BINAXNOW: SARS Coronavirus 2 Ag: NEGATIVE

## 2022-09-23 MED ORDER — HYDROCOD POLI-CHLORPHE POLI ER 10-8 MG/5ML PO SUER: 5.0000 mL | Freq: Every evening | ORAL | 0 refills | Status: DC | PRN

## 2022-09-23 NOTE — Patient Instructions (Signed)
You may take the cough suppressant at bedtime as needed for cough and rest. Caution this medication contains codeine which may cause drowsiness.   Continue Tylenol for fevers, headaches, body aches.  Please call us in two days if your symptoms become worse.  It was a pleasure meeting you!

## 2022-09-23 NOTE — Progress Notes (Signed)
Subjective:    Patient ID: [name], female    DOB: [birth year], 56 y.o.   MRN: [id number]  Cough Associated symptoms include chills, a fever, headaches and myalgias. Pertinent negatives include no chest pain or sore throat.    Heron Sabins Tillison is a very pleasant 56 y.o. female patient of Dr. Glori Bickers with a history of hyperlipidemia, tobacco use who presents today to discuss cough.  Symptom onset 5 days ago with headaches and body aches. She then developed dry cough, nasal congestion, fevers. Temperatures have been running 99-101. She's taking Nyquil and Tylenol with temporary improvement.   She denies sick contacts. She tested negative for Covid-19 infection yesterday with a home test. Her cough is the most bothersome symptom, worse at night.   She quit smoking cigarettes >1 year ago. She mostly vapes now. She denies significant sputum production, shortness of breath.    Review of Systems  Constitutional:  Positive for chills, fatigue and fever.  HENT:  Positive for congestion. Negative for sore throat.   Respiratory:  Positive for cough.   Cardiovascular:  Negative for chest pain.  Musculoskeletal:  Positive for myalgias.  Neurological:  Positive for headaches.         Past Medical History:  Diagnosis Date   Allergy    Anxiety    Eczema    Fibrocystic breast    Hyperlipidemia    Pneumonia    Seizures ([place])    per pt had 1 seizure at age 16 and 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 maw   Tobacco abuse    quit 2011    Social History   Socioeconomic History   Marital status: Married    Spouse name: Not on file   Number of children: 2   Years of education: Not on file   Highest education level: Some college, no degree  Occupational History   Occupation: [employer]  Tobacco Use   Smoking status: Every Day    Packs/day: 1.00    Types: Cigarettes   Smokeless tobacco: Current   Tobacco comments:    As of 04/08/2019, smokes 1 pack/day  Substance and Sexual Activity    Alcohol use: Yes    Alcohol/week: 0.0 standard drinks of alcohol    Comment: occ   Drug use: No   Sexual activity: Not on file  Other Topics Concern   Not on file  Social History Narrative   Not on file   Social Determinants of Health   Financial Resource Strain: Low Risk  (04/17/2021)   Overall Financial Resource Strain (CARDIA)    Difficulty of Paying Living Expenses: Not hard at all  Food Insecurity: No Food Insecurity (04/17/2021)   Hunger Vital Sign    Worried About Running Out of Food in the Last Year: Never true    Ran Out of Food in the Last Year: Never true  Transportation Needs: No Transportation Needs (04/17/2021)   PRAPARE - Hydrologist (Medical): No    Lack of Transportation (Non-Medical): No  Physical Activity: Insufficiently Active (04/17/2021)   Exercise Vital Sign    Days of Exercise per Week: 2 days    Minutes of Exercise per Session: 30 min  Stress: No Stress Concern Present (04/17/2021)   [place]    Feeling of Stress : Only a little  Social Connections: Socially Isolated (04/17/2021)   Social Connection and Isolation Panel [NHANES]    Frequency of Communication with Friends  and Family: Once a week    Frequency of Social Gatherings with Friends and Family: Once a week    Attends Religious Services: Never    Marine scientist or Organizations: No    Attends Music therapist: Not on file    Marital Status: Married  Human resources officer Violence: Not on file    Past Surgical History:  Procedure Laterality Date   BREAST CYST EXCISION     pneumonia     TONSILLECTOMY     TUBAL LIGATION      Family History  Problem Relation Age of Onset   Diabetes Mother    Hypertension Mother    Ovarian cysts Mother    Hypertension Father    Asthma Father    Cancer Cousin        melanoma   Migraines Sister    Cancer Maternal Grandmother        breast    Cancer Paternal Grandfather        lung   Colon cancer Neg Hx    Esophageal cancer Neg Hx    Stomach cancer Neg Hx    Rectal cancer Neg Hx     Allergies  Allergen Reactions   Adhesive [Tape]     Per the pt , "when I were tape for a long time, days, it breaks out my skin". maw   Neomycin-Bacitracin Zn-Polymyx     REACTION: rash   Penicillins     REACTION: rash   Sulfamethoxazole-Trimethoprim Rash    Current Outpatient Medications on File Prior to Visit  Medication Sig Dispense Refill   rosuvastatin (CRESTOR) 5 MG tablet TAKE 1 TABLET BY MOUTH EVERY DAY 90 tablet 2   venlafaxine XR (EFFEXOR-XR) 37.5 MG 24 hr capsule TAKE 1 CAPSULE BY MOUTH DAILY WITH BREAKFAST. 90 capsule 1   ALPRAZolam (XANAX) 0.5 MG tablet Take 1 tablet (0.5 mg total) by mouth 2 (two) times daily as needed for anxiety or sleep (severe anxiety). (Patient not taking: Reported on 09/23/2022) 15 tablet 0   No current facility-administered medications on file prior to visit.    BP (!) 146/82   Pulse 94   Temp 97.6 F (36.4 C) (Temporal)   Ht '5\' 5"'$  (1.651 m)   Wt 168 lb (76.2 kg)   SpO2 96%   BMI 27.96 kg/m  Objective:   Physical Exam Constitutional:      General: She is not in acute distress.    Appearance: She is ill-appearing.  HENT:     Right Ear: Tympanic membrane and ear canal normal.     Left Ear: Tympanic membrane and ear canal normal.     Nose:     Right Sinus: No maxillary sinus tenderness or frontal sinus tenderness.     Left Sinus: No maxillary sinus tenderness or frontal sinus tenderness.     Mouth/Throat:     Pharynx: No posterior oropharyngeal erythema.  Eyes:     Conjunctiva/sclera: Conjunctivae normal.  Cardiovascular:     Rate and Rhythm: Normal rate and regular rhythm.  Pulmonary:     Effort: Pulmonary effort is normal.     Breath sounds: Normal breath sounds. No wheezing or rales.  Musculoskeletal:     Cervical back: Neck supple.  Lymphadenopathy:     Cervical: No cervical  adenopathy.  Skin:    General: Skin is warm and dry.           Assessment & Plan:   Problem List Items Addressed This Visit  Respiratory   Viral URI with cough - Primary    Symptoms and presentation today representative of viral etiology. Lower suspicion for COPD exacerbation.   Negative Covid-19 and influenza tests today.  Continue conservative care. Rx for Tussionex sent to pharmacy to use HS.   We discussed to call in two days (day 7 of symptom onset) if symptoms are worsening/not improving. She is stable for outpatient treatment.       Relevant Orders   Influenza A/B   POC COVID-19       Pleas Koch, NP

## 2022-09-23 NOTE — Assessment & Plan Note (Signed)
Symptoms and presentation today representative of viral etiology. Lower suspicion for COPD exacerbation.   Negative Covid-19 and influenza tests today.  Continue conservative care. Rx for Tussionex sent to pharmacy to use HS.   We discussed to call in two days (day 7 of symptom onset) if symptoms are worsening/not improving. She is stable for outpatient treatment.

## 2022-09-26 ENCOUNTER — Ambulatory Visit: Payer: BC Managed Care – PPO | Admitting: Family

## 2022-09-28 ENCOUNTER — Other Ambulatory Visit: Payer: Self-pay | Admitting: Family Medicine

## 2022-10-04 ENCOUNTER — Telehealth: Payer: Self-pay | Admitting: Family Medicine

## 2022-10-04 DIAGNOSIS — J209 Acute bronchitis, unspecified: Secondary | ICD-10-CM | POA: Diagnosis not present

## 2022-10-04 DIAGNOSIS — R059 Cough, unspecified: Secondary | ICD-10-CM | POA: Diagnosis not present

## 2022-10-04 NOTE — Telephone Encounter (Signed)
Patient called back and stated that she was advised to go to an UC by access nurse. Patient stated that she is in the parking  lot at [facility] now.   Patient is going to be seen there since she is already there.

## 2022-10-04 NOTE — Telephone Encounter (Signed)
Patient called and stated she is having difficulty breathing within her last appointment from last week and the medication that she has had run out. Patient was sent to access nurse.

## 2022-10-04 NOTE — Telephone Encounter (Signed)
Left a message on voicemail for patient to call the office back. 

## 2022-10-04 NOTE — Telephone Encounter (Signed)
Noted  

## 2022-10-04 NOTE — Telephone Encounter (Signed)
Patient was advised to update me a few days after her visit on 09/23/22 and it's now 10/04/22.   If she's struggling to breathe then needs to be seen immediately in the ED. If not, then I can add her on for today at 3:20, arrival time of 3:10 pm.

## 2022-10-04 NOTE — Telephone Encounter (Signed)
Access nurse called stating that patient needs to be seen within 4 hours. Nurse stated that patient is having wheezing and crackling in her lungs.  Advised Danica that we do not have anything available in the office today. Fredric Dine stated that she will advise the patient to go to [facility] or ER.

## 2022-10-04 NOTE — Telephone Encounter (Signed)
PLEASE NOTE: All timestamps contained within this report are represented as [place] Standard Time. CONFIDENTIALTY NOTICE: This fax transmission is intended only for the addressee. It contains information that is legally privileged, confidential or otherwise protected from use or disclosure. If you are not the intended recipient, you are strictly prohibited from reviewing, disclosing, copying using or disseminating any of this information or taking any action in reliance on or regarding this information. If you have received this fax in error, please notify us immediately by telephone so that we can arrange for its return to [place]. Phone: [phone], Toll-Free: [phone], Fax: [phone] Page: 1 of 2 Call Id: [id number] [facility] - Client TELEPHONE ADVICE RECORD [facility] Patient Name: Claudia Carter Gender: Female DOB: [birth year] Age: 56 Y 33 M 23 D Return Phone Number: [phone] (Primary), [phone] (Secondary) Address: [address] City/ State/ Zip: [place] [address] Client [facility] - Client Client Site [place] - Day Provider Alma Friendly - NP Contact Type Call Who Is Calling Patient / Member / Family / Caregiver Call Type Triage / Clinical Relationship To Patient Self Return Phone Number Please choose phone number Chief Complaint WHEEZING Reason for Call Symptomatic / Request for [place] states that she was seen in the office last week and she is still not feeling well and it is very hard for her to breath. Translation No Nurse Assessment Nurse: Ellery Plunk, RN, Danica Date/Time (Eastern Time): 10/04/2022 12:03:36 PM Confirm and document reason for call. If symptomatic, describe symptoms. ---caller states she is having wheezing and crackling in her lungs when she breathes. was at the office last week and dx with viral URI. feeling  lightheaded when she coughs. denies fever at this time. Does the patient have any new or worsening symptoms? ---Yes Will a triage be completed? ---Yes Related visit to physician within the last 2 weeks? ---Yes Does the PT have any chronic conditions? (i.e. diabetes, asthma, this includes High risk factors for pregnancy, etc.) ---No Is this a behavioral health or substance abuse call? ---No Guidelines Guideline Title Affirmed Question Affirmed Notes Nurse Date/Time Eilene Ghazi Time) Cough - Acute Productive Wheezing is present Bringas, RN, Danica 10/04/2022 12:04:52 PM Disp. Time Eilene Ghazi Time) Disposition Final User 10/04/2022 12:01:49 PM Send to Urgent Queue Loni Beckwith 10/04/2022 12:12:24 PM See HCP within 4 Hours (or PCP triage) Yes Bringas, RN, Danica PLEASE NOTE: All timestamps contained within this report are represented as [place] Standard Time. CONFIDENTIALTY NOTICE: This fax transmission is intended only for the addressee. It contains information that is legally privileged, confidential or otherwise protected from use or disclosure. If you are not the intended recipient, you are strictly prohibited from reviewing, disclosing, copying using or disseminating any of this information or taking any action in reliance on or regarding this information. If you have received this fax in error, please notify us immediately by telephone so that we can arrange for its return to [place]. Phone: [phone], Toll-Free: [phone], Fax: [phone] Page: 2 of 2 Call Id: [id number] Final Disposition 10/04/2022 12:12:24 PM See HCP within 4 Hours (or PCP triage) Yes Bringas, RN, Danica Caller Disagree/Comply Comply Caller Understands Yes PreDisposition InappropriateToAsk Care Advice Given Per Guideline SEE HCP (OR PCP TRIAGE) WITHIN 4 HOURS: * IF OFFICE WILL BE OPEN: You need to be seen within the next 3 or 4 hours. Call your doctor (or NP/PA) now or as soon as the office opens. * IF OFFICE  WILL BE CLOSED AND NO PCP (PRIMARY CARE PROVIDER) SECOND-LEVEL TRIAGE: You need to be seen within the next 3 or 4 hours. A nearby Urgent Care Center [facility]) is often a good source of care. Another choice is to go to the ED. Go sooner if you become worse. CALL BACK IF: * You become worse CARE ADVICE given per Cough - Acute Productive (Adult) guideline. Referrals GO TO FACILITY UNDECIDED

## 2022-10-04 NOTE — Telephone Encounter (Signed)
Ordinarily it would recommend a course of steroids but this noted it was very hard to breathe an I agree ER or UC for urgent eval if that is the case  I will cc to Anda Kraft who saw her   Aware, will watch for correspondence

## 2022-12-28 ENCOUNTER — Other Ambulatory Visit: Payer: Self-pay | Admitting: Family Medicine

## 2023-02-04 ENCOUNTER — Other Ambulatory Visit: Payer: Self-pay | Admitting: Family Medicine

## 2023-02-04 DIAGNOSIS — Z0000 Encounter for general adult medical examination without abnormal findings: Secondary | ICD-10-CM

## 2023-03-19 ENCOUNTER — Ambulatory Visit
Admission: RE | Admit: 2023-03-19 | Discharge: 2023-03-19 | Disposition: A | Discharge status: Home / Self Care | Payer: BC Managed Care – PPO | Source: Ambulatory Visit | Attending: Family Medicine | Admitting: Family Medicine

## 2023-03-19 DIAGNOSIS — Z0000 Encounter for general adult medical examination without abnormal findings: Secondary | ICD-10-CM

## 2023-03-19 DIAGNOSIS — Z1231 Encounter for screening mammogram for malignant neoplasm of breast: Secondary | ICD-10-CM | POA: Diagnosis not present

## 2023-04-02 ENCOUNTER — Other Ambulatory Visit: Payer: Self-pay | Admitting: Family Medicine

## 2023-04-02 NOTE — Telephone Encounter (Signed)
Lvmtcb, sent mychart message  

## 2023-04-02 NOTE — Telephone Encounter (Signed)
Last CPE was 02/04/22, pt is overdue for CPE (labs prior), please schedule and then route back to to refill

## 2023-04-04 ENCOUNTER — Ambulatory Visit (INDEPENDENT_AMBULATORY_CARE_PROVIDER_SITE_OTHER): Payer: BC Managed Care – PPO | Admitting: Family Medicine

## 2023-04-04 ENCOUNTER — Encounter: Payer: Self-pay | Admitting: Family Medicine

## 2023-04-04 VITALS — BP 122/70 | HR 90 | Temp 97.9°F | Ht 65.0 in | Wt 171.1 lb

## 2023-04-04 DIAGNOSIS — F32A Depression, unspecified: Secondary | ICD-10-CM

## 2023-04-04 DIAGNOSIS — Z0000 Encounter for general adult medical examination without abnormal findings: Secondary | ICD-10-CM

## 2023-04-04 DIAGNOSIS — Z720 Tobacco use: Secondary | ICD-10-CM | POA: Diagnosis not present

## 2023-04-04 DIAGNOSIS — E7800 Pure hypercholesterolemia, unspecified: Secondary | ICD-10-CM | POA: Diagnosis not present

## 2023-04-04 DIAGNOSIS — F419 Anxiety disorder, unspecified: Secondary | ICD-10-CM | POA: Diagnosis not present

## 2023-04-04 NOTE — Progress Notes (Unsigned)
Subjective:    Patient ID: Claudia Carter, female    DOB: 01-04-1966, 57 y.o.   MRN: [id number]  HPI Here for health maintenance exam and to review chronic medical problems    Wt Readings from Last 3 Encounters:  04/04/23 171 lb 2 oz (77.6 kg)  09/23/22 168 lb (76.2 kg)  02/04/22 169 lb (76.7 kg)   28.48 kg/m  Vitals:   04/04/23 1518  BP: 122/70  Pulse: 90  Temp: 97.9 F (36.6 C)  SpO2: 97%    Has been working  Enjoying her grand kids   Feeling good   Has a knot in her R ear canal -not changing  That ear has always had tinnitus   Has some burning in feet at night   Taking vit B6 and folic acid for hair thinning      Immunization History  Administered Date(s) Administered   H1N1 12/08/2008   Influenza Split 12/06/2011   Influenza Whole 10/04/2002   Influenza,inj,Quad PF,6+ Mos 10/11/2013, 09/06/2015, 12/20/2016, 01/23/2018   PFIZER(Purple Top)SARS-COV-2 Vaccination 01/23/2020, 02/15/2020   Pneumococcal Polysaccharide-23 11/04/2007   Td 10/04/2002   Tdap 12/06/2011, 02/04/2022   Health Maintenance Due  Topic Date Due   Hepatitis C Screening  Never done   Zoster Vaccines- Shingrix (1 of 2) Never done   COVID-19 Vaccine (3 - 2023-24 season) 07/19/2022    Zoster vaccine - may consider later   Mammogram 03/2023  Self breast exam: no lumps   Colonoscopy 12/2015 with 10 y recall   Pap 01/2022 normal with neg HPV  No gyn symptoms   Vaping status - still daily /nicotine / less than one pod per day  Not ready to quit Wants to quit someday No breathing symptoms   Mood H/o dep and anx  Effexor xr 37.5 mg daily      02/04/2022    2:02 PM 05/16/2020    3:12 PM 05/05/2019    5:14 PM 01/23/2018    9:57 AM  Depression screen PHQ 2/9  Decreased Interest 0 0 0 0  Down, Depressed, Hopeless 0 0 0 1  PHQ - 2 Score 0 0 0 1  Altered sleeping 0   2  Tired, decreased energy 0   2  Change in appetite 0   0  Feeling bad or failure about yourself  0   2   Trouble concentrating 0   0  Moving slowly or fidgety/restless 0   0  Suicidal thoughts 0   0  PHQ-9 Score 0   7  Difficult doing work/chores Not difficult at all      Better Medicine really helps    Hyperlipidemia Lab Results  Component Value Date   CHOL 183 01/28/2022   HDL 74.20 01/28/2022   LDLCALC 75 01/28/2022   LDLDIRECT 147.0 04/28/2020   TRIG 166.0 (H) 01/28/2022   CHOLHDL 2 01/28/2022   Crestor 5 mg daily  Patient Active Problem List   Diagnosis Date Noted   Anxiety and depression 03/07/2022   Current every day nicotine vaping 02/04/2022   Somnolence 01/18/2019   Snoring 01/18/2019   Screening mammogram, encounter for 12/20/2016   Colon cancer screening 09/06/2015   Encounter for routine gynecological examination 10/11/2013   Routine general medical examination at a health care facility 11/26/2011   Hyperlipidemia 12/08/2008   FIBROCYSTIC BREAST DISEASE 10/27/2007   ECZEMA 10/27/2007   Past Medical History:  Diagnosis Date   Allergy    Anxiety    Eczema  Fibrocystic breast    Hyperlipidemia    Pneumonia    Seizures ([facility])    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 maw   Tobacco abuse    quit 2011   Past Surgical History:  Procedure Laterality Date   BREAST CYST EXCISION     pneumonia     TONSILLECTOMY     TUBAL LIGATION     Social History   Tobacco Use   Smoking status: Every Day    Types: E-cigarettes   Smokeless tobacco: Never   Tobacco comments:    Only vapes now  Substance Use Topics   Alcohol use: Yes    Alcohol/week: 0.0 standard drinks of alcohol    Comment: occ   Drug use: No   Family History  Problem Relation Age of Onset   Diabetes Mother    Hypertension Mother    Ovarian cysts Mother    Hypertension Father    Asthma Father    Cancer Cousin        melanoma   Migraines Sister    Cancer Maternal Grandmother        breast   Cancer Paternal Grandfather        lung   Colon cancer Neg Hx     Esophageal cancer Neg Hx    Stomach cancer Neg Hx    Rectal cancer Neg Hx    Allergies  Allergen Reactions   Adhesive [Tape]     Per the pt , "when I were tape for a long time, days, it breaks out my skin". maw   Neomycin-Bacitracin Zn-Polymyx     REACTION: rash   Penicillins     REACTION: rash   Sulfamethoxazole-Trimethoprim Rash   Current Outpatient Medications on File Prior to Visit  Medication Sig Dispense Refill   rosuvastatin (CRESTOR) 5 MG tablet TAKE 1 TABLET BY MOUTH EVERY DAY 90 tablet 0   venlafaxine XR (EFFEXOR-XR) 37.5 MG 24 hr capsule TAKE 1 CAPSULE BY MOUTH DAILY WITH BREAKFAST. 90 capsule 0   No current facility-administered medications on file prior to visit.     Review of Systems  Constitutional:  Negative for activity change, appetite change, fatigue, fever and unexpected weight change.  HENT:  Negative for congestion, ear pain, rhinorrhea, sinus pressure and sore throat.   Eyes:  Negative for pain, redness and visual disturbance.  Respiratory:  Negative for cough, shortness of breath and wheezing.   Cardiovascular:  Negative for chest pain and palpitations.  Gastrointestinal:  Negative for abdominal pain, blood in stool, constipation and diarrhea.  Endocrine: Negative for polydipsia and polyuria.  Genitourinary:  Negative for dysuria, frequency and urgency.  Musculoskeletal:  Negative for arthralgias, back pain and myalgias.  Skin:  Negative for pallor and rash.       Little bump in r ear canal  Hair thinning   Allergic/Immunologic: Negative for environmental allergies.  Neurological:  Negative for dizziness, syncope and headaches.  Hematological:  Negative for adenopathy. Does not bruise/bleed easily.  Psychiatric/Behavioral:  Negative for decreased concentration and dysphoric mood. The patient is not nervous/anxious.        Objective:   Physical Exam Constitutional:      General: She is not in acute distress.    Appearance: Normal appearance. She  is well-developed. She is not ill-appearing or diaphoretic.  HENT:     Head: Normocephalic and atraumatic.     Right Ear: Tympanic membrane, ear canal and external ear normal.  Left Ear: Tympanic membrane, ear canal and external ear normal.     Ears:     Comments: 1-2 mm white bump in R ear canal- resembles comedone Not tender     Nose: Nose normal. No congestion.     Mouth/Throat:     Mouth: Mucous membranes are moist.     Pharynx: Oropharynx is clear. No posterior oropharyngeal erythema.  Eyes:     General: No scleral icterus.    Extraocular Movements: Extraocular movements intact.     Conjunctiva/sclera: Conjunctivae normal.     Pupils: Pupils are equal, round, and reactive to light.  Neck:     Thyroid: No thyromegaly.     Vascular: No carotid bruit or JVD.  Cardiovascular:     Rate and Rhythm: Normal rate and regular rhythm.     Pulses: Normal pulses.     Heart sounds: Normal heart sounds.     No gallop.  Pulmonary:     Effort: Pulmonary effort is normal. No respiratory distress.     Breath sounds: Normal breath sounds. No wheezing.     Comments: Good air exch Chest:     Chest wall: No tenderness.  Abdominal:     General: Bowel sounds are normal. There is no distension or abdominal bruit.     Palpations: Abdomen is soft. There is no mass.     Tenderness: There is no abdominal tenderness.     Hernia: No hernia is present.  Genitourinary:    Comments: Breast exam: No mass, nodules, thickening, tenderness, bulging, retraction, inflamation, nipple discharge or skin changes noted.  No axillary or clavicular LA.     Musculoskeletal:        General: No tenderness. Normal range of motion.     Cervical back: Normal range of motion and neck supple. No rigidity. No muscular tenderness.     Right lower leg: No edema.     Left lower leg: No edema.     Comments: No kyphosis   Lymphadenopathy:     Cervical: No cervical adenopathy.  Skin:    General: Skin is warm and dry.      Coloration: Skin is not pale.     Findings: No erythema or rash.     Comments: Solar lentigines diffusely   Neurological:     Mental Status: She is alert. Mental status is at baseline.     Cranial Nerves: No cranial nerve deficit.     Motor: No abnormal muscle tone.     Coordination: Coordination normal.     Gait: Gait normal.     Deep Tendon Reflexes: Reflexes are normal and symmetric.  Psychiatric:        Mood and Affect: Mood normal.        Cognition and Memory: Cognition and memory normal.           Assessment & Plan:   Problem List Items Addressed This Visit       Other   Anxiety and depression    Doing much better with effexor xr 37.5 mg daily  PHQ of 0 Wants to continue this Encouraged good self care with more exercise        Current every day nicotine vaping    Encouraged cessation  She is not ready  No pulmonary complaints or changes  Discussed strategies to consider quitting      Hyperlipidemia    Disc goals for lipids and reasons to control them Rev last labs with pt Rev low sat  fat diet in detail Taking crestor 5 mg daily  Lab ordered for lipid prof      Relevant Orders   Lipid panel (Completed)   Routine general medical examination at a health care facility - Primary    Reviewed health habits including diet and exercise and skin cancer prevention Reviewed appropriate screening tests for age  Also reviewed health mt list, fam hx and immunization status , as well as social and family history   See HPI Labs reviewed and ordered Discussed shingrix-plans to check on coverage and get it  Mammogram utd 03/2023 Colonoscopy 12/2015 with 10 y recall Pap 01/2022 normal with neg HPV screen  Still vaping nicotine product-not ready to quit / discussed strategies  PHQ of 0 on effexor xr        Relevant Orders   TSH (Completed)   Lipid panel (Completed)   Comprehensive metabolic panel (Completed)   CBC with Differential/Platelet (Completed)

## 2023-04-04 NOTE — Patient Instructions (Addendum)
If you are interested in the shingles vaccine series (Shingrix), call your insurance or pharmacy to check on coverage and location it must be given.  If affordable - you can schedule it here or at your pharmacy depending on coverage    Eat a balanced diet  Add vitamin B12 500 mcg daily   Try to get 1200-1500 mg of calcium per day with at least 2000 iu of vitamin D - for bone health   Do some cardio exercise - walking/biking   Add some strength training to your routine, this is important for bone and brain health and can reduce your risk of falls and help your body use insulin properly and regulate weight  Light weights, exercise bands , and internet videos are a good way to start  Yoga (chair or regular), machines , floor exercises or a gym with machines are also good options

## 2023-04-05 LAB — COMPREHENSIVE METABOLIC PANEL
AG Ratio: 2.1 (calc) (ref 1.0–2.5)
ALT: 22 U/L (ref 6–29)
AST: 21 U/L (ref 10–35)
Albumin: 4.7 g/dL (ref 3.6–5.1)
Alkaline phosphatase (APISO): 78 U/L (ref 37–153)
BUN: 17 mg/dL (ref 7–25)
CO2: 24 mmol/L (ref 20–32)
Calcium: 9.5 mg/dL (ref 8.6–10.4)
Chloride: 103 mmol/L (ref 98–110)
Creat: 0.79 mg/dL (ref 0.50–1.03)
Globulin: 2.2 g/dL (calc) (ref 1.9–3.7)
Glucose, Bld: 94 mg/dL (ref 65–99)
Potassium: 4 mmol/L (ref 3.5–5.3)
Sodium: 139 mmol/L (ref 135–146)
Total Bilirubin: 0.4 mg/dL (ref 0.2–1.2)
Total Protein: 6.9 g/dL (ref 6.1–8.1)

## 2023-04-05 LAB — TSH: TSH: 1.28 mIU/L (ref 0.40–4.50)

## 2023-04-05 LAB — CBC WITH DIFFERENTIAL/PLATELET
Absolute Monocytes: 423 cells/uL (ref 200–950)
Basophils Absolute: 41 cells/uL (ref 0–200)
Basophils Relative: 0.8 %
Eosinophils Absolute: 112 cells/uL (ref 15–500)
Eosinophils Relative: 2.2 %
HCT: 40.5 % (ref 35.0–45.0)
Hemoglobin: 13.9 g/dL (ref 11.7–15.5)
Lymphs Abs: 1632 cells/uL (ref 850–3900)
MCH: 31.9 pg (ref 27.0–33.0)
MCHC: 34.3 g/dL (ref 32.0–36.0)
MCV: 92.9 fL (ref 80.0–100.0)
MPV: 10.4 fL (ref 7.5–12.5)
Monocytes Relative: 8.3 %
Neutro Abs: 2892 cells/uL (ref 1500–7800)
Neutrophils Relative %: 56.7 %
Platelets: 229 10*3/uL (ref 140–400)
RBC: 4.36 10*6/uL (ref 3.80–5.10)
RDW: 11.9 % (ref 11.0–15.0)
Total Lymphocyte: 32 %
WBC: 5.1 10*3/uL (ref 3.8–10.8)

## 2023-04-05 LAB — LIPID PANEL
Cholesterol: 181 mg/dL (ref <200)
HDL: 69 mg/dL (ref >=50)
LDL Cholesterol (Calc): 80 mg/dL (calc)
Non-HDL Cholesterol (Calc): 112 mg/dL (calc) (ref <130)
Total CHOL/HDL Ratio: 2.6 (calc) (ref <5.0)
Triglycerides: 227 mg/dL — ABNORMAL HIGH (ref <150)

## 2023-04-06 NOTE — Assessment & Plan Note (Signed)
Doing much better with effexor xr 37.5 mg daily  PHQ of 0 Wants to continue this Encouraged good self care with more exercise

## 2023-04-06 NOTE — Assessment & Plan Note (Signed)
Reviewed health habits including diet and exercise and skin cancer prevention Reviewed appropriate screening tests for age  Also reviewed health mt list, fam hx and immunization status , as well as social and family history   See HPI Labs reviewed and ordered Discussed shingrix-plans to check on coverage and get it  Mammogram utd 03/2023 Colonoscopy 12/2015 with 10 y recall Pap 01/2022 normal with neg HPV screen  Still vaping nicotine product-not ready to quit / discussed strategies  PHQ of 0 on effexor xr

## 2023-04-06 NOTE — Assessment & Plan Note (Signed)
Disc goals for lipids and reasons to control them Rev last labs with pt Rev low sat fat diet in detail Taking crestor 5 mg daily  Lab ordered for lipid prof

## 2023-04-06 NOTE — Assessment & Plan Note (Signed)
Encouraged cessation  She is not ready  No pulmonary complaints or changes  Discussed strategies to consider quitting

## 2023-04-07 ENCOUNTER — Encounter: Payer: Self-pay | Admitting: *Deleted

## 2023-05-14 ENCOUNTER — Ambulatory Visit (INDEPENDENT_AMBULATORY_CARE_PROVIDER_SITE_OTHER): Payer: BC Managed Care – PPO | Admitting: Family Medicine

## 2023-05-14 ENCOUNTER — Encounter: Payer: Self-pay | Admitting: Family Medicine

## 2023-05-14 VITALS — BP 128/82 | HR 79 | Temp 97.6°F | Ht 65.0 in | Wt 173.2 lb

## 2023-05-14 DIAGNOSIS — M79601 Pain in right arm: Secondary | ICD-10-CM

## 2023-05-14 DIAGNOSIS — R400 Somnolence: Secondary | ICD-10-CM

## 2023-05-14 DIAGNOSIS — L659 Nonscarring hair loss, unspecified: Secondary | ICD-10-CM | POA: Diagnosis not present

## 2023-05-14 DIAGNOSIS — M79602 Pain in left arm: Secondary | ICD-10-CM | POA: Diagnosis not present

## 2023-05-14 DIAGNOSIS — E7800 Pure hypercholesterolemia, unspecified: Secondary | ICD-10-CM

## 2023-05-14 DIAGNOSIS — R5382 Chronic fatigue, unspecified: Secondary | ICD-10-CM

## 2023-05-14 DIAGNOSIS — M25532 Pain in left wrist: Secondary | ICD-10-CM | POA: Diagnosis not present

## 2023-05-14 DIAGNOSIS — R5383 Other fatigue: Secondary | ICD-10-CM | POA: Insufficient documentation

## 2023-05-14 LAB — SEDIMENTATION RATE: Sed Rate: 7 mm/hr (ref 0–30)

## 2023-05-14 LAB — FERRITIN: Ferritin: 394.8 ng/mL — ABNORMAL HIGH (ref 10.0–291.0)

## 2023-05-14 LAB — IRON: Iron: 123 ug/dL (ref 42–145)

## 2023-05-14 LAB — VITAMIN B12: Vitamin B-12: 176 pg/mL — ABNORMAL LOW (ref 211–911)

## 2023-05-14 LAB — C-REACTIVE PROTEIN: CRP: <1 mg/dL (ref 0.5–20.0)

## 2023-05-14 LAB — CK: Total CK: 54 U/L (ref 7–177)

## 2023-05-14 LAB — VITAMIN D 25 HYDROXY (VIT D DEFICIENCY, FRACTURES): VITD: 22.28 ng/mL — ABNORMAL LOW (ref 30.00–100.00)

## 2023-05-14 NOTE — Assessment & Plan Note (Signed)
May be multi factoral , worsening recenlly Thinks mood is pretty good  Some hair loss (diffuse) and also msk complaints Lab today  Will review last sleep apnea work up  Continue current medicines

## 2023-05-14 NOTE — Assessment & Plan Note (Signed)
On crestor  Disc goals for lipids and reasons to control them Rev last labs with pt Rev low sat fat diet in detail   Does not think this is cause for arm pain/on it long time Ck added to labs today

## 2023-05-14 NOTE — Assessment & Plan Note (Signed)
ESR and CRP ordered to r/o PMR

## 2023-05-14 NOTE — Progress Notes (Signed)
Subjective:    Patient ID: Claudia Carter, female    DOB: [birth year], 57 y.o.   MRN: [id number]  HPI  Wt Readings from Last 3 Encounters:  05/14/23 173 lb 4 oz (78.6 kg)  04/04/23 171 lb 2 oz (77.6 kg)  09/23/22 168 lb (76.2 kg)   28.83 kg/m  Vitals:   05/14/23 0946  BP: 128/82  Pulse: 79  Temp: 97.6 F (36.4 C)  SpO2: 97%   Pt presents for multiple problems including  Fatigue Hair loss Arm pain  Vision changes  (has eye exam planned next week) -floaters in right eye (lot of eye strain and does not wear corrective lenses)   Has not felt great in general  Fatigue / lack of energy  Hair is thinning/ crown/ general thinning but no focal areas  ,  no scalp problems  Some pain in both arms - started in elbows and now wrists  Not in shoulder girdle or back   Has been on crestor for years-does not bother her   Cancelled a work trip due to fatigue   Was tested for sleep apnea  Was not severe enough to use cpap  Does not nod off very often Does wake up unrefreshed   Exercise - (not enough) Some hiking but not regular    Mother has some hair thinner in the top     Smoking status- vapes/ nicotine / e cig  Supplements : B6, folic acid  B12 - gave her GI upset  Vitamin D     Mood is oberall good with effexor     05/14/2023    9:52 AM 02/04/2022    2:02 PM 05/16/2020    3:12 PM 05/05/2019    5:14 PM 01/23/2018    9:57 AM  Depression screen PHQ 2/9  Decreased Interest 1 0 0 0 0  Down, Depressed, Hopeless 1 0 0 0 1  PHQ - 2 Score 2 0 0 0 1  Altered sleeping 3 0   2  Tired, decreased energy 3 0   2  Change in appetite 1 0   0  Feeling bad or failure about yourself  1 0   2  Trouble concentrating 1 0   0  Moving slowly or fidgety/restless 0 0   0  Suicidal thoughts 0 0   0  PHQ-9 Score 11 0   7  Difficult doing work/chores Somewhat difficult Not difficult at all         Last labs Lab Results  Component Value Date   NA 139 04/04/2023   K 4.0  04/04/2023   CO2 24 04/04/2023   GLUCOSE 94 04/04/2023   BUN 17 04/04/2023   CREATININE 0.79 04/04/2023   CALCIUM 9.5 04/04/2023   GFR 79.80 01/28/2022   GFRNONAA 82.52 02/28/2010   Lab Results  Component Value Date   ALT 22 04/04/2023   AST 21 04/04/2023   ALKPHOS 71 01/28/2022   BILITOT 0.4 04/04/2023   Lab Results  Component Value Date   WBC 5.1 04/04/2023   HGB 13.9 04/04/2023   HCT 40.5 04/04/2023   MCV 92.9 04/04/2023   PLT 229 04/04/2023   No results found for: "IRON", "TIBC", "FERRITIN" Lab Results  Component Value Date   TSH 1.28 04/04/2023   No results found for: "VITAMINB12"   Last vitamin D No results found for: "25OHVITD2", "25OHVITD3", "VD25OH"   Patient Active Problem List   Diagnosis Date Noted   Fatigue 05/14/2023  Hair loss 05/14/2023   Left wrist pain 05/14/2023   Bilateral arm pain 05/14/2023   Anxiety and depression 03/07/2022   Current every day nicotine vaping 02/04/2022   Somnolence 01/18/2019   Snoring 01/18/2019   Screening mammogram, encounter for 12/20/2016   Colon cancer screening 09/06/2015   Encounter for routine gynecological examination 10/11/2013   Routine general medical examination at a health care facility 11/26/2011   Hyperlipidemia 12/08/2008   FIBROCYSTIC BREAST DISEASE 10/27/2007   ECZEMA 10/27/2007   Past Medical History:  Diagnosis Date   Allergy    Anxiety    Eczema    Fibrocystic breast    Hyperlipidemia    Pneumonia    Seizures (HCC)    per pt had 1 seizure at age 24 and 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 maw   Tobacco abuse    quit 2011   Past Surgical History:  Procedure Laterality Date   BREAST CYST EXCISION     pneumonia     TONSILLECTOMY     TUBAL LIGATION     Social History   Tobacco Use   Smoking status: Every Day    Types: E-cigarettes   Smokeless tobacco: Never   Tobacco comments:    Only vapes now  Substance Use Topics   Alcohol use: Yes    Alcohol/week: 0.0 standard drinks of alcohol     Comment: occ   Drug use: No   Family History  Problem Relation Age of Onset   Diabetes Mother    Hypertension Mother    Ovarian cysts Mother    Hypertension Father    Asthma Father    Cancer Cousin        melanoma   Migraines Sister    Cancer Maternal Grandmother        breast   Cancer Paternal Grandfather        lung   Colon cancer Neg Hx    Esophageal cancer Neg Hx    Stomach cancer Neg Hx    Rectal cancer Neg Hx    Allergies  Allergen Reactions   Adhesive [Tape]     Per the pt , "when I were tape for a long time, days, it breaks out my skin". maw   Neomycin-Bacitracin Zn-Polymyx     REACTION: rash   Penicillins     REACTION: rash   Sulfamethoxazole-Trimethoprim Rash   Current Outpatient Medications on File Prior to Visit  Medication Sig Dispense Refill   rosuvastatin (CRESTOR) 5 MG tablet TAKE 1 TABLET BY MOUTH EVERY DAY 90 tablet 0   venlafaxine XR (EFFEXOR-XR) 37.5 MG 24 hr capsule TAKE 1 CAPSULE BY MOUTH DAILY WITH BREAKFAST. 90 capsule 0   No current facility-administered medications on file prior to visit.    Review of Systems  Constitutional:  Positive for fatigue. Negative for activity change, appetite change, fever and unexpected weight change.  HENT:  Negative for congestion, ear pain, rhinorrhea, sinus pressure and sore throat.   Eyes:  Negative for pain, redness and visual disturbance.  Respiratory:  Negative for cough, shortness of breath and wheezing.   Cardiovascular:  Negative for chest pain and palpitations.  Gastrointestinal:  Negative for abdominal pain, blood in stool, constipation and diarrhea.  Endocrine: Negative for polydipsia and polyuria.  Genitourinary:  Negative for dysuria, frequency and urgency.  Musculoskeletal:  Positive for arthralgias and myalgias. Negative for back pain and joint swelling.  Skin:  Negative for pallor and rash.       Hair loss  Allergic/Immunologic: Negative for environmental allergies.  Neurological:  Negative  for dizziness, syncope and headaches.  Hematological:  Negative for adenopathy. Does not bruise/bleed easily.  Psychiatric/Behavioral:  Negative for decreased concentration and dysphoric mood. The patient is not nervous/anxious.        Objective:   Physical Exam Constitutional:      General: She is not in acute distress.    Appearance: Normal appearance. She is well-developed. She is not ill-appearing or diaphoretic.     Comments: Overweight   HENT:     Head: Normocephalic and atraumatic.     Right Ear: Tympanic membrane and ear canal normal.     Left Ear: Tympanic membrane and ear canal normal.     Nose: Nose normal.     Mouth/Throat:     Mouth: Mucous membranes are moist.     Pharynx: Oropharynx is clear.  Eyes:     General: No scleral icterus.    Conjunctiva/sclera: Conjunctivae normal.     Pupils: Pupils are equal, round, and reactive to light.  Neck:     Thyroid: No thyromegaly.     Vascular: No carotid bruit or JVD.  Cardiovascular:     Rate and Rhythm: Normal rate and regular rhythm.     Heart sounds: Normal heart sounds.     No gallop.  Pulmonary:     Effort: Pulmonary effort is normal. No respiratory distress.     Breath sounds: Normal breath sounds. No wheezing or rales.  Abdominal:     General: There is no distension or abdominal bruit.     Palpations: Abdomen is soft.  Musculoskeletal:     Cervical back: Normal range of motion and neck supple.     Right lower leg: No edema.     Left lower leg: No edema.     Comments: Some tenderness in right medial epicondyle area  Also in antecubital areas   Post finkelstein's test in left wrist -causing mild pain   No acute joint swelling   Lymphadenopathy:     Cervical: No cervical adenopathy.  Skin:    General: Skin is warm and dry.     Coloration: Skin is not jaundiced or pale.     Findings: No bruising, erythema or rash.     Comments: No focal alopecia   Neurological:     Mental Status: She is alert.      Coordination: Coordination normal.     Deep Tendon Reflexes: Reflexes are normal and symmetric. Reflexes normal.  Psychiatric:        Mood and Affect: Mood normal.     Comments: Mood is good  Candidly discusses symptoms and stressors             Assessment & Plan:   Problem List Items Addressed This Visit       Other   Somnolence    Pt had work up for OSA in past and did not qualify for cpap   Has nodded off occational but not often Is generally tired       Left wrist pain    Some pain with Finkelstin's test Also diffuse arm pain   Ref to sport med for ? Tendonitis  Encouraged trial of voltaren gel and ice       Hyperlipidemia    On crestor  Disc goals for lipids and reasons to control them Rev last labs with pt Rev low sat fat diet in detail   Does not think this is cause for arm  pain/on it long time Ck added to labs today      Hair loss    In crown- thinning / no focal alopecia Reviewed last labs More lab today incl vitamin levels and iron  Consider dermatology  Consider rogaine otc      Relevant Orders   Ferritin   Iron   Vitamin B12   VITAMIN D 25 Hydroxy (Vit-D Deficiency, Fractures)   Fatigue - Primary    May be multi factoral , worsening recenlly Thinks mood is pretty good  Some hair loss (diffuse) and also msk complaints Lab today  Will review last sleep apnea work up  Continue current medicines       Relevant Orders   Ferritin   Iron   Vitamin B12   VITAMIN D 25 Hydroxy (Vit-D Deficiency, Fractures)   Bilateral arm pain    ESR and CRP ordered to r/o PMR         Relevant Orders   Sedimentation Rate   C-reactive protein   CK

## 2023-05-14 NOTE — Assessment & Plan Note (Signed)
Pt had work up for OSA in past and did not qualify for cpap   Has nodded off occational but not often Is generally tired

## 2023-05-14 NOTE — Assessment & Plan Note (Addendum)
Some pain with Finkelstin's test Also diffuse arm pain   Ref to sport med for ? Tendonitis  Encouraged trial of voltaren gel and ice

## 2023-05-14 NOTE — Assessment & Plan Note (Signed)
In crown- thinning / no focal alopecia Reviewed last labs More lab today incl vitamin levels and iron  Consider dermatology  Consider rogaine otc

## 2023-05-14 NOTE — Patient Instructions (Addendum)
Add some strength training to your routine, this is important for bone and brain health and can reduce your risk of falls and help your body use insulin properly and regulate weight  Light weights, exercise bands , and internet videos are a good way to start  Yoga (chair or regular), machines , floor exercises or a gym with machines are also good options    Labs today    Use some voltaren gel 1% over the counter on painful areas up to four times daily  Also ice   See the eye doctor

## 2023-05-19 ENCOUNTER — Ambulatory Visit: Payer: BC Managed Care – PPO | Admitting: Family Medicine

## 2023-05-21 ENCOUNTER — Ambulatory Visit (INDEPENDENT_AMBULATORY_CARE_PROVIDER_SITE_OTHER): Payer: BC Managed Care – PPO | Admitting: Family Medicine

## 2023-05-21 ENCOUNTER — Encounter: Payer: Self-pay | Admitting: Family Medicine

## 2023-05-21 VITALS — BP 128/80 | HR 83 | Temp 98.6°F | Ht 65.0 in | Wt 172.4 lb

## 2023-05-21 DIAGNOSIS — E538 Deficiency of other specified B group vitamins: Secondary | ICD-10-CM | POA: Diagnosis not present

## 2023-05-21 DIAGNOSIS — E559 Vitamin D deficiency, unspecified: Secondary | ICD-10-CM

## 2023-05-21 DIAGNOSIS — R5382 Chronic fatigue, unspecified: Secondary | ICD-10-CM

## 2023-05-21 DIAGNOSIS — R7989 Other specified abnormal findings of blood chemistry: Secondary | ICD-10-CM | POA: Diagnosis not present

## 2023-05-21 LAB — HEPATIC FUNCTION PANEL
ALT: 24 U/L (ref 0–35)
AST: 23 U/L (ref 0–37)
Albumin: 4.6 g/dL (ref 3.5–5.2)
Alkaline Phosphatase: 65 U/L (ref 39–117)
Bilirubin, Direct: 0.1 mg/dL (ref 0.0–0.3)
Total Bilirubin: 0.6 mg/dL (ref 0.2–1.2)
Total Protein: 7.2 g/dL (ref 6.0–8.3)

## 2023-05-21 LAB — CBC WITH DIFFERENTIAL/PLATELET
Basophils Absolute: 0 10*3/uL (ref 0.0–0.1)
Basophils Relative: 0.5 % (ref 0.0–3.0)
Eosinophils Absolute: 0.1 10*3/uL (ref 0.0–0.7)
Eosinophils Relative: 3.6 % (ref 0.0–5.0)
HCT: 40.6 % (ref 36.0–46.0)
Hemoglobin: 13.8 g/dL (ref 12.0–15.0)
Lymphocytes Relative: 38.7 % (ref 12.0–46.0)
Lymphs Abs: 1.5 10*3/uL (ref 0.7–4.0)
MCHC: 34 g/dL (ref 30.0–36.0)
MCV: 93.5 fl (ref 78.0–100.0)
Monocytes Absolute: 0.3 10*3/uL (ref 0.1–1.0)
Monocytes Relative: 7.2 % (ref 3.0–12.0)
Neutro Abs: 2 10*3/uL (ref 1.4–7.7)
Neutrophils Relative %: 50 % (ref 43.0–77.0)
Platelets: 213 10*3/uL (ref 150.0–400.0)
RBC: 4.34 Mil/uL (ref 3.87–5.11)
RDW: 11.7 % (ref 11.5–15.5)
WBC: 4 10*3/uL (ref 4.0–10.5)

## 2023-05-21 MED ORDER — CYANOCOBALAMIN 1000 MCG/ML IJ SOLN
1000.0000 ug | Freq: Once | INTRAMUSCULAR | Status: AC
  Administered 2023-05-21: 1000 ug via INTRAMUSCULAR

## 2023-05-21 NOTE — Progress Notes (Unsigned)
Subjective:    Patient ID: Claudia Carter, female    DOB: 03/05/1966, 57 y.o.   MRN: [id number]  HPI  Wt Readings from Last 3 Encounters:  05/21/23 172 lb 6.4 oz (78.2 kg)  05/14/23 173 lb 4 oz (78.6 kg)  04/04/23 171 lb 2 oz (77.6 kg)   28.69 kg/m  Vitals:   05/21/23 0806  BP: 128/80  Pulse: 83  Temp: 98.6 F (37 C)  SpO2: 96%    Pt presents for follow up of labs/ B12 deficiency and fatigue and muscle /joint pain  Low vit D and high ferritin   Taking 1000 mcg daily B12  Taking vitamin D with food now (tolerates it better)    Labs from last visit    Lab Results  Component Value Date   VITAMINB12 176 (L) 05/14/2023    Last vitamin D Lab Results  Component Value Date   VD25OH 22.28 (L) 05/14/2023    Lab Results  Component Value Date   CRP <1.0 05/14/2023   Lab Results  Component Value Date   ESRSEDRATE 7 05/14/2023   Lab Results  Component Value Date   FERRITIN 394.8 (H) 05/14/2023   Lab Results  Component Value Date   IRON 123 05/14/2023   FERRITIN 394.8 (H) 05/14/2023    Lab Results  Component Value Date   WBC 4.0 05/21/2023   HGB 13.8 05/21/2023   HCT 40.6 05/21/2023   MCV 93.5 05/21/2023   PLT 213.0 05/21/2023    No known family history of hemachromatosis     Patient Active Problem List   Diagnosis Date Noted   Current every day nicotine vaping 02/04/2022    Priority: High   Vitamin D deficiency 05/22/2023   Elevated ferritin 05/21/2023   Vitamin B12 deficiency 05/21/2023   Fatigue 05/14/2023   Hair loss 05/14/2023   Left wrist pain 05/14/2023   Bilateral arm pain 05/14/2023   Anxiety and depression 03/07/2022   Somnolence 01/18/2019   Snoring 01/18/2019   Screening mammogram, encounter for 12/20/2016   Colon cancer screening 09/06/2015   Encounter for routine gynecological examination 10/11/2013   Routine general medical examination at a health care facility 11/26/2011   Hyperlipidemia 12/08/2008   FIBROCYSTIC  BREAST DISEASE 10/27/2007   ECZEMA 10/27/2007   Past Medical History:  Diagnosis Date   Allergy    Anxiety    Eczema    Fibrocystic breast    Hyperlipidemia    Pneumonia    Seizures (HCC)    per pt had 1 seizure at age 89 and 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 maw   Tobacco abuse    quit 2011   Past Surgical History:  Procedure Laterality Date   BREAST CYST EXCISION     pneumonia     TONSILLECTOMY     TUBAL LIGATION     Social History   Tobacco Use   Smoking status: Every Day    Types: E-cigarettes   Smokeless tobacco: Never   Tobacco comments:    Only vapes now  Substance Use Topics   Alcohol use: Yes    Alcohol/week: 0.0 standard drinks of alcohol    Comment: occ   Drug use: No   Family History  Problem Relation Age of Onset   Diabetes Mother    Hypertension Mother    Ovarian cysts Mother    Hypertension Father    Asthma Father    Cancer Cousin        melanoma   Migraines Sister  Cancer Maternal Grandmother        breast   Cancer Paternal Grandfather        lung   Colon cancer Neg Hx    Esophageal cancer Neg Hx    Stomach cancer Neg Hx    Rectal cancer Neg Hx    Allergies  Allergen Reactions   Adhesive [Tape]     Per the pt , "when I were tape for a long time, days, it breaks out my skin". maw   Neomycin-Bacitracin Zn-Polymyx     REACTION: rash   Penicillins     REACTION: rash   Sulfamethoxazole-Trimethoprim Rash   Current Outpatient Medications on File Prior to Visit  Medication Sig Dispense Refill   cyanocobalamin (VITAMIN B12) 1000 MCG tablet Take 1,000 mcg by mouth daily.     rosuvastatin (CRESTOR) 5 MG tablet TAKE 1 TABLET BY MOUTH EVERY DAY 90 tablet 0   venlafaxine XR (EFFEXOR-XR) 37.5 MG 24 hr capsule TAKE 1 CAPSULE BY MOUTH DAILY WITH BREAKFAST. 90 capsule 0   No current facility-administered medications on file prior to visit.    Review of Systems  Constitutional:  Positive for fatigue. Negative for activity change, appetite change, fever  and unexpected weight change.  HENT:  Negative for congestion, ear pain, rhinorrhea, sinus pressure and sore throat.   Eyes:  Negative for pain, redness and visual disturbance.  Respiratory:  Negative for cough, shortness of breath and wheezing.   Cardiovascular:  Negative for chest pain and palpitations.  Gastrointestinal:  Negative for abdominal pain, blood in stool, constipation and diarrhea.  Endocrine: Negative for polydipsia and polyuria.  Genitourinary:  Negative for dysuria, frequency and urgency.  Musculoskeletal:  Positive for arthralgias and myalgias. Negative for back pain.  Skin:  Negative for pallor and rash.  Allergic/Immunologic: Negative for environmental allergies.  Neurological:  Negative for dizziness, syncope and headaches.  Hematological:  Negative for adenopathy. Does not bruise/bleed easily.  Psychiatric/Behavioral:  Negative for decreased concentration and dysphoric mood. The patient is not nervous/anxious.        Objective:   Physical Exam Constitutional:      General: She is not in acute distress.    Appearance: Normal appearance. She is well-developed. She is not ill-appearing or diaphoretic.     Comments: Overweight    HENT:     Head: Normocephalic and atraumatic.     Mouth/Throat:     Pharynx: Oropharynx is clear.  Eyes:     General: No scleral icterus.    Conjunctiva/sclera: Conjunctivae normal.     Pupils: Pupils are equal, round, and reactive to light.  Neck:     Thyroid: No thyromegaly.     Vascular: No carotid bruit or JVD.  Cardiovascular:     Rate and Rhythm: Normal rate and regular rhythm.     Heart sounds: Normal heart sounds.     No gallop.  Pulmonary:     Effort: Pulmonary effort is normal. No respiratory distress.     Breath sounds: Normal breath sounds. No stridor. No wheezing, rhonchi or rales.  Abdominal:     General: Abdomen is protuberant. There is no distension or abdominal bruit.     Palpations: Abdomen is soft. There is no  hepatomegaly, splenomegaly, mass or pulsatile mass.     Tenderness: There is abdominal tenderness.  Musculoskeletal:     Cervical back: Normal range of motion and neck supple.     Right lower leg: No edema.     Left lower leg:  No edema.  Lymphadenopathy:     Cervical: No cervical adenopathy.  Skin:    General: Skin is warm and dry.     Coloration: Skin is not jaundiced or pale.     Findings: No bruising or rash.  Neurological:     Mental Status: She is alert.     Sensory: No sensory deficit.     Motor: No weakness.     Coordination: Coordination normal.     Deep Tendon Reflexes: Reflexes are normal and symmetric. Reflexes normal.  Psychiatric:        Mood and Affect: Mood normal.           Assessment & Plan:   Problem List Items Addressed This Visit       Other   Vitamin D deficiency    Last vitamin D Lab Results  Component Value Date   VD25OH 22.28 (L) 05/14/2023  Encouraged her to start taking 2000-4000 international units of D3 daily  Re check at next labs 4-6 wk  Will increase if needed Discussed importance to bone and overall health      Vitamin B12 deficiency    Lab Results  Component Value Date   VITAMINB12 176 (L) 05/14/2023  B12 shot today  Then 1000 mcg oral daily- re check 4-6 wk       Relevant Orders   CBC with Differential/Platelet (Completed)   Fatigue    Suspect multifactorial  Labs were reviewed   Low B12- shot today and oral supplementation  Low D- oral supplementation  High ferritin (normal iron)- lab today for cbc, lft and hemachromatosis dna         Relevant Orders   Hemochromatosis DNA-PCR(c282y,h63d)   CBC with Differential/Platelet (Completed)   Elevated ferritin - Primary    High ferritin but normal iron  Cbc/ lft and hemachromatosis DNA test today   She has had more pain in joints and muscles as well   Will refer to heme if needed       Relevant Orders   Hemochromatosis DNA-PCR(c282y,h63d)   Hepatic function panel  (Completed)   CBC with Differential/Platelet (Completed)

## 2023-05-21 NOTE — Patient Instructions (Addendum)
B12 shot today   Continue the oral B12 Continue the vitamin D3 - let us know how much you are taking next time     I want to re check B12 level in 4-6 weeks   Eat a balanced diet

## 2023-05-22 DIAGNOSIS — E559 Vitamin D deficiency, unspecified: Secondary | ICD-10-CM | POA: Insufficient documentation

## 2023-05-22 NOTE — Assessment & Plan Note (Signed)
Suspect multifactorial  Labs were reviewed   Low B12- shot today and oral supplementation  Low D- oral supplementation  High ferritin (normal iron)- lab today for cbc, lft and hemachromatosis dna

## 2023-05-22 NOTE — Assessment & Plan Note (Signed)
Last vitamin D Lab Results  Component Value Date   VD25OH 22.28 (L) 05/14/2023   Encouraged her to start taking 2000-4000 international units of D3 daily  Re check at next labs 4-6 wk  Will increase if needed Discussed importance to bone and overall health

## 2023-05-22 NOTE — Assessment & Plan Note (Signed)
Lab Results  Component Value Date   VITAMINB12 176 (L) 05/14/2023   B12 shot today  Then 1000 mcg oral daily- re check 4-6 wk

## 2023-05-22 NOTE — Assessment & Plan Note (Signed)
High ferritin but normal iron  Cbc/ lft and hemachromatosis DNA test today   She has had more pain in joints and muscles as well   Will refer to heme if needed

## 2023-06-09 ENCOUNTER — Encounter: Payer: Self-pay | Admitting: Family Medicine

## 2023-06-10 LAB — HEMOCHROMATOSIS DNA-PCR(C282Y,H63D)

## 2023-06-10 NOTE — Telephone Encounter (Signed)
Agree with ER precautions  Will see her then

## 2023-06-10 NOTE — Telephone Encounter (Signed)
I spoke with pt; pt tested + covid on 06/09/23; pt symptoms began on 06/09/23 with prod cough with yellow to brown phlegm, temp on 06/09/23 101.2; pt has not cked temp today.. no CP,SOB,and no wheezing. Pt said has scratchy throat; pt in no distress. No available appts at [facility] 06/10/23 but offered appt at another [facility] office and pt said she would wait until 06/11/23 to be seen at [facility]. Pt scheduled VV with Dr Milinda Antis on 06/11/23 at 11:30 with UC and ED precautions and pt voiced understanding. Pt will drink plenty of fluids rest and will self quarantine for 5 days.sending note to Dr Milinda Antis and [employer].

## 2023-06-11 ENCOUNTER — Encounter: Payer: Self-pay | Admitting: Family Medicine

## 2023-06-11 ENCOUNTER — Telehealth: Payer: Self-pay | Admitting: Family Medicine

## 2023-06-11 ENCOUNTER — Telehealth (INDEPENDENT_AMBULATORY_CARE_PROVIDER_SITE_OTHER): Payer: BC Managed Care – PPO | Admitting: Family Medicine

## 2023-06-11 VITALS — Temp 100.0°F

## 2023-06-11 DIAGNOSIS — R7989 Other specified abnormal findings of blood chemistry: Secondary | ICD-10-CM

## 2023-06-11 DIAGNOSIS — U071 COVID-19: Secondary | ICD-10-CM | POA: Diagnosis not present

## 2023-06-11 MED ORDER — PROMETHAZINE-DM 6.25-15 MG/5ML PO SYRP: 5.0000 mL | ORAL_SOLUTION | Freq: Four times a day (QID) | ORAL | 0 refills | Status: DC | PRN

## 2023-06-11 MED ORDER — NIRMATRELVIR/RITONAVIR (PAXLOVID)TABLET: 3.0000 | ORAL_TABLET | Freq: Two times a day (BID) | ORAL | 0 refills | Status: DC

## 2023-06-11 MED ORDER — BENZONATATE 200 MG PO CAPS: 200.0000 mg | ORAL_CAPSULE | Freq: Three times a day (TID) | ORAL | 1 refills | Status: DC | PRN

## 2023-06-11 NOTE — Telephone Encounter (Signed)
I used to use molnupiravir but I don't think it is still indicated  If another pharmacy has it I am happy to send otherwise no other recommendation

## 2023-06-11 NOTE — Assessment & Plan Note (Signed)
Lab Results  Component Value Date   FERRITIN 394.8 (H) 05/14/2023  .normal iron  Lab Results  Component Value Date   WBC 4.0 05/21/2023   HGB 13.8 05/21/2023   HCT 40.6 05/21/2023   MCV 93.5 05/21/2023   PLT 213.0 05/21/2023    Pt has muscle aches and fatigue Discussed possible of hereditary hemochromocytosis -and genetic test reviewed  Referral done to hematologyfor further discussion

## 2023-06-11 NOTE — Progress Notes (Signed)
Virtual Visit via Video Note  I connected with Claudia Carter on 06/11/23 at 11:30 AM EDT by a video enabled telemedicine application and verified that I am speaking with the correct person using two identifiers.  Location: Patient: home Provider: office    I discussed the limitations of evaluation and management by telemedicine and the availability of in person appointments. The patient expressed understanding and agreed to proceed.  Parties involved in encounter  Patient: Claudia Carter   Provider:  Roxy Manns MD   History of Present Illness: Pt presents for covid 19  Also discuss testing for Hemachromatosis / elevated ferritin   Tested positive for covid 7/22 Temp up to 101.2  Temp is not as bad   ST  Cough -makes ST worse  No shortness of breath  No cp  No wheezing  Some rattling but not brining it up   Body aches  Ears are ok / occational pop  Occational nasal congestion   Smoking: vapes currently   Covid vaccine in 2021   Over the counter Tylenol  Nyquil  Drinking fluids   Not hungry   No loss of taste or smell   No n/v Little bit of loose stool    Has never had covid before   Lab Results  Component Value Date   NA 139 04/04/2023   K 4.0 04/04/2023   CO2 24 04/04/2023   GLUCOSE 94 04/04/2023   BUN 17 04/04/2023   CREATININE 0.79 04/04/2023   CALCIUM 9.5 04/04/2023   GFR 79.80 01/28/2022   GFRNONAA 82.52 02/28/2010    Elevated ferritin  Lab Results  Component Value Date   FERRITIN 394.8 (H) 05/14/2023   Lab Results  Component Value Date   IRON 123 05/14/2023   FERRITIN 394.8 (H) 05/14/2023    Lab Results  Component Value Date   ALT 24 05/21/2023   AST 23 05/21/2023   ALKPHOS 65 05/21/2023   BILITOT 0.6 05/21/2023   Also low B12 Lab Results  Component Value Date   VITAMINB12 176 (L) 05/14/2023      RESULT: 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 This patient is negative for the  C282Y pathogenic variant. Only 1% of individuals with a  biochemical diagnosis of hereditary hemochromatosis (HH)  have this genotype. 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 However, this genotype does not predict a  diagnosis of HH in an asymptomatic individual, as less than  2% of individuals with this genotype will develop symptoms  or clinical evidence of this disorder. Disease diagnosis can  only be made by demonstration of elevated iron stores.  Genetic counseling is recommended to discuss the potential  clinical implications of this result.  .   Patient Active Problem List   Diagnosis Date Noted   Current every day nicotine vaping 02/04/2022    Priority: High   COVID-19 06/11/2023   Vitamin D deficiency 05/22/2023   Elevated ferritin 05/21/2023   Vitamin B12 deficiency 05/21/2023   Fatigue 05/14/2023   Hair loss 05/14/2023   Left wrist pain 05/14/2023   Bilateral arm pain 05/14/2023   Anxiety and depression 03/07/2022   Somnolence 01/18/2019   Snoring 01/18/2019   Screening mammogram, encounter for 12/20/2016   Colon cancer screening 09/06/2015   Encounter for  routine gynecological examination 10/11/2013   Routine general medical examination at a health care facility 11/26/2011   Hyperlipidemia 12/08/2008   FIBROCYSTIC BREAST DISEASE 10/27/2007   ECZEMA 10/27/2007   Past Medical History:  Diagnosis Date   Allergy    Anxiety    Eczema    Fibrocystic breast    Hyperlipidemia    Pneumonia    Seizures (HCC)    per pt had 1 seizure at age 66 and 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 maw   Tobacco abuse    quit 2011   Past Surgical History:  Procedure Laterality Date   BREAST CYST EXCISION     pneumonia     TONSILLECTOMY     TUBAL LIGATION     Social History   Tobacco Use   Smoking status: Every Day     Types: E-cigarettes   Smokeless tobacco: Never   Tobacco comments:    Only vapes now  Substance Use Topics   Alcohol use: Yes    Alcohol/week: 0.0 standard drinks of alcohol    Comment: occ   Drug use: No   Family History  Problem Relation Age of Onset   Diabetes Mother    Hypertension Mother    Ovarian cysts Mother    Hypertension Father    Asthma Father    Cancer Cousin        melanoma   Migraines Sister    Cancer Maternal Grandmother        breast   Cancer Paternal Grandfather        lung   Colon cancer Neg Hx    Esophageal cancer Neg Hx    Stomach cancer Neg Hx    Rectal cancer Neg Hx    Allergies  Allergen Reactions   Adhesive [Tape]     Per the pt , "when I were tape for a long time, days, it breaks out my skin". maw   Neomycin-Bacitracin Zn-Polymyx     REACTION: rash   Penicillins     REACTION: rash   Sulfamethoxazole-Trimethoprim Rash   Current Outpatient Medications on File Prior to Visit  Medication Sig Dispense Refill   cyanocobalamin (VITAMIN B12) 1000 MCG tablet Take 1,000 mcg by mouth daily.     folic acid (FOLVITE) 800 MCG tablet Take 400 mcg by mouth daily.     pyridOXINE (B-6) 50 MG tablet Take 50 mg by mouth daily.     rosuvastatin (CRESTOR) 5 MG tablet TAKE 1 TABLET BY MOUTH EVERY DAY 90 tablet 0   venlafaxine XR (EFFEXOR-XR) 37.5 MG 24 hr capsule TAKE 1 CAPSULE BY MOUTH DAILY WITH BREAKFAST. 90 capsule 0   VITAMIN D PO Take 600 mg by mouth daily.     No current facility-administered medications on file prior to visit.    Review of Systems  Constitutional:  Positive for fever and malaise/fatigue. Negative for chills.  HENT:  Positive for congestion and sore throat. Negative for ear pain and sinus pain.   Eyes:  Negative for blurred vision, discharge and redness.  Respiratory:  Positive for cough and sputum production. Negative for hemoptysis, shortness of breath, wheezing and stridor.   Cardiovascular:  Negative for chest pain,  palpitations and leg swelling.  Gastrointestinal:  Negative for abdominal pain, diarrhea, nausea and vomiting.  Musculoskeletal:  Negative for myalgias.  Skin:  Negative for rash.  Neurological:  Positive for headaches. Negative for dizziness.    Observations/Objective:  Patient appears well, in no distress Does seem fatigued  Weight is baseline  No facial swelling or asymmetry Sounds congested and slightly hoarse , occational sniffling  No obvious tremor or mobility impairment Moving neck and UEs normally Able to hear the call well  No cough or shortness of breath during interview (does clear throat)  Talkative and mentally sharp with no cognitive changes No skin changes on face or neck , no rash or pallor Affect is normal   Assessment and Plan: Problem List Items Addressed This Visit       Other   Elevated ferritin    Lab Results  Component Value Date   FERRITIN 394.8 (H) 05/14/2023  .normal iron  Lab Results  Component Value Date   WBC 4.0 05/21/2023   HGB 13.8 05/21/2023   HCT 40.6 05/21/2023   MCV 93.5 05/21/2023   PLT 213.0 05/21/2023    Pt has muscle aches and fatigue Discussed possible of hereditary hemochromocytosis -and genetic test reviewed  Referral done to hematologyfor further discussion       COVID-19 - Primary    Mild to moderate resp symptoms  Discussed symptom control -see AVS Sent in prometh dm and tessalon for cough  Paxlovid -antiviral , discussed possible side effects will update (pt over 49 with some chronic conditions) Handouts given  Call back and Er precautions noted in detail today  Update if not starting to improve in a week or if worsening  Pt instructed  Isolate until your symptoms are better (a minimum of 5 days) Then mask for an additional 10 days when you go back into public  Update if not starting to improve in a week or if worsening         Relevant Medications   nirmatrelvir/ritonavir (PAXLOVID) 20 x 150 MG & 10 x  100MG  TABS     Follow Up Instructions: Drink fluids and rest  Try the prometh dm for bad cough (it may sedate) You can also use the tessalon peales Try not to smoke/vape= use this as an opportunity to cut back or quit if you can Nasal saline for congestion as needed  Tylenol for fever or pain or headache  Take the antiviral (Paxlovid) as directed If side effects or concerns please hold it and reach out to [place]  Please alert [place] if symptoms worsen (if severe or short of breath please go to the ER)    Update if not starting to improve in a week or if worsening   Isolate for 5 days minimum or until symptoms are resolved  Then mask when in public for 10 more days   I will place a hematology referral for the elevated ferritin (iron stores)   I discussed the assessment and treatment plan with the patient. The patient was provided an opportunity to ask questions and all were answered. The patient agreed with the plan and demonstrated an understanding of the instructions.   The patient was advised to call back or seek an in-person evaluation if the symptoms worsen or if the condition fails to improve as anticipated.     Roxy Manns, MD

## 2023-06-11 NOTE — Assessment & Plan Note (Signed)
Mild to moderate resp symptoms  Discussed symptom control -see AVS Sent in prometh dm and tessalon for cough  Paxlovid -antiviral , discussed possible side effects will update (pt over 49 with some chronic conditions) Handouts given  Call back and Er precautions noted in detail today  Update if not starting to improve in a week or if worsening  Pt instructed  Isolate until your symptoms are better (a minimum of 5 days) Then mask for an additional 10 days when you go back into public  Update if not starting to improve in a week or if worsening

## 2023-06-11 NOTE — Telephone Encounter (Signed)
Patient called in and stated that nirmatrelvir/ritonavir (PAXLOVID) 20 x 150 MG & 10 x 100MG  TABS is on back order. She was wanting to know if there is something else that can be sent in for her. Thank you!

## 2023-06-11 NOTE — Patient Instructions (Signed)
Drink fluids and rest  Try the prometh dm for bad cough (it may sedate) You can also use the tessalon peales Try not to smoke/vape= use this as an opportunity to cut back or quit if you can Nasal saline for congestion as needed  Tylenol for fever or pain or headache  Take the antiviral (Paxlovid) as directed If side effects or concerns please hold it and reach out to [place]  Please alert [place] if symptoms worsen (if severe or short of breath please go to the ER)    Update if not starting to improve in a week or if worsening   Isolate for 5 days minimum or until symptoms are resolved  Then mask when in public for 10 more days   I will place a hematology referral for the elevated ferritin (iron stores)

## 2023-06-12 ENCOUNTER — Telehealth: Payer: Self-pay | Admitting: Family Medicine

## 2023-06-12 MED ORDER — NIRMATRELVIR/RITONAVIR (PAXLOVID)TABLET: 3.0000 | ORAL_TABLET | Freq: Two times a day (BID) | ORAL | 0 refills | Status: AC

## 2023-06-12 NOTE — Telephone Encounter (Signed)
Patient contacted the office regarding medication paxlovid, states she was prescribed this during virtual visit yesterday. Patient says her preferred pharmacy does not have this medication in stock, but [facility] on [address] in [place] does have it. Patient asked if this could be sent there for her to pick up. Please advise, thank you

## 2023-06-12 NOTE — Telephone Encounter (Signed)
See separate phone note, [facility]

## 2023-06-12 NOTE — Telephone Encounter (Signed)
Rx sent and pt notified.

## 2023-06-12 NOTE — Telephone Encounter (Signed)
Spoke with pt relaying Dr. Royden Purl message. Pt verbalizes understanding and will call around to find a pharmacy with Paxlovid. Pt will let us know what she finds.

## 2023-06-24 ENCOUNTER — Telehealth: Payer: Self-pay | Admitting: Family Medicine

## 2023-06-24 DIAGNOSIS — R7989 Other specified abnormal findings of blood chemistry: Secondary | ICD-10-CM

## 2023-06-24 DIAGNOSIS — E538 Deficiency of other specified B group vitamins: Secondary | ICD-10-CM

## 2023-06-24 NOTE — Telephone Encounter (Signed)
-----   Message from Lovena Neighbours sent at 06/09/2023  4:12 PM EDT ----- Regarding: Labs for 8.7.24 Pt is on lab schedule for 8.7.24, please put order in future. Thank you, Denny Peon

## 2023-06-25 ENCOUNTER — Other Ambulatory Visit (INDEPENDENT_AMBULATORY_CARE_PROVIDER_SITE_OTHER): Payer: BC Managed Care – PPO

## 2023-06-25 DIAGNOSIS — R7989 Other specified abnormal findings of blood chemistry: Secondary | ICD-10-CM | POA: Diagnosis not present

## 2023-06-25 DIAGNOSIS — E538 Deficiency of other specified B group vitamins: Secondary | ICD-10-CM | POA: Diagnosis not present

## 2023-06-25 LAB — CBC WITH DIFFERENTIAL/PLATELET
Basophils Absolute: 0 10*3/uL (ref 0.0–0.1)
Basophils Relative: 0.6 % (ref 0.0–3.0)
Eosinophils Absolute: 0.2 10*3/uL (ref 0.0–0.7)
Eosinophils Relative: 3.9 % (ref 0.0–5.0)
HCT: 38.9 % (ref 36.0–46.0)
Hemoglobin: 13 g/dL (ref 12.0–15.0)
Lymphocytes Relative: 44.6 % (ref 12.0–46.0)
Lymphs Abs: 2.1 10*3/uL (ref 0.7–4.0)
MCHC: 33.4 g/dL (ref 30.0–36.0)
MCV: 93.1 fl (ref 78.0–100.0)
Monocytes Absolute: 0.4 10*3/uL (ref 0.1–1.0)
Monocytes Relative: 7.8 % (ref 3.0–12.0)
Neutro Abs: 2 10*3/uL (ref 1.4–7.7)
Neutrophils Relative %: 43.1 % (ref 43.0–77.0)
Platelets: 278 10*3/uL (ref 150.0–400.0)
RBC: 4.18 Mil/uL (ref 3.87–5.11)
RDW: 12.3 % (ref 11.5–15.5)
WBC: 4.7 10*3/uL (ref 4.0–10.5)

## 2023-06-25 LAB — VITAMIN B12: Vitamin B-12: 720 pg/mL (ref 211–911)

## 2023-06-25 LAB — FERRITIN: Ferritin: 570.8 ng/mL — ABNORMAL HIGH (ref 10.0–291.0)

## 2023-06-25 LAB — IRON: Iron: 126 ug/dL (ref 42–145)

## 2023-06-26 DIAGNOSIS — H43811 Vitreous degeneration, right eye: Secondary | ICD-10-CM | POA: Diagnosis not present

## 2023-06-29 ENCOUNTER — Other Ambulatory Visit: Payer: Self-pay | Admitting: Family Medicine

## 2023-06-30 DIAGNOSIS — M654 Radial styloid tenosynovitis [de Quervain]: Secondary | ICD-10-CM | POA: Diagnosis not present

## 2023-07-02 ENCOUNTER — Inpatient Hospital Stay: Payer: BC Managed Care – PPO

## 2023-07-02 ENCOUNTER — Inpatient Hospital Stay: Payer: BC Managed Care – PPO | Attending: Internal Medicine | Admitting: Internal Medicine

## 2023-07-02 ENCOUNTER — Encounter: Payer: Self-pay | Admitting: Internal Medicine

## 2023-07-02 VITALS — BP 132/86 | HR 90 | Temp 98.6°F | Ht 65.0 in | Wt 175.6 lb

## 2023-07-02 DIAGNOSIS — R7989 Other specified abnormal findings of blood chemistry: Secondary | ICD-10-CM

## 2023-07-02 DIAGNOSIS — E538 Deficiency of other specified B group vitamins: Secondary | ICD-10-CM | POA: Insufficient documentation

## 2023-07-02 DIAGNOSIS — Z803 Family history of malignant neoplasm of breast: Secondary | ICD-10-CM | POA: Insufficient documentation

## 2023-07-02 DIAGNOSIS — Z801 Family history of malignant neoplasm of trachea, bronchus and lung: Secondary | ICD-10-CM | POA: Insufficient documentation

## 2023-07-02 DIAGNOSIS — F17290 Nicotine dependence, other tobacco product, uncomplicated: Secondary | ICD-10-CM | POA: Insufficient documentation

## 2023-07-02 DIAGNOSIS — G629 Polyneuropathy, unspecified: Secondary | ICD-10-CM | POA: Insufficient documentation

## 2023-07-02 DIAGNOSIS — R200 Anesthesia of skin: Secondary | ICD-10-CM | POA: Diagnosis not present

## 2023-07-02 DIAGNOSIS — R5383 Other fatigue: Secondary | ICD-10-CM | POA: Diagnosis not present

## 2023-07-02 DIAGNOSIS — Z808 Family history of malignant neoplasm of other organs or systems: Secondary | ICD-10-CM | POA: Diagnosis not present

## 2023-07-02 LAB — IRON AND TIBC
Iron: 92 ug/dL (ref 28–170)
Saturation Ratios: 24 % (ref 10.4–31.8)
TIBC: 384 ug/dL (ref 250–450)
UIBC: 292 ug/dL

## 2023-07-02 LAB — C-REACTIVE PROTEIN: CRP: 0.8 mg/dL (ref <1.0)

## 2023-07-02 LAB — FERRITIN: Ferritin: 393 ng/mL — ABNORMAL HIGH (ref 11–307)

## 2023-07-02 NOTE — Assessment & Plan Note (Addendum)
#   ELEVATED FERRITIN: Patient slightly elevated ferritin [JULY-AUG 2024-FERRITIN: 300-500; Iron saturations: NA].  # I discussed the causes of elevated ferritin could be multiple-including not limited to hereditary hemochromatosis. inflammation/liver disease/metabolic syndrome etc.    # I long discussion regarding the potential diagnosis of hereditary hemochromatosis-the potential complications including but not limited to worsening fatigue; arthritis; cardiomyopathy hypogonadism, diabetes skin changes etc.  Again reminded that it is extremely rare to have a clinical syndrome of hemochromatosis as long as patient having regular blood checks; and phlebotomy as needed for elevated ferritin.    # Check labs-iron studies ferritin CRP hemochromatosis genotyping   # Fatigue unclear etiology-question related to above.  Await further workup.  # Bilateral lower extremity neuropathy low B12- ?  Etiology-B12 tablets/injections as per PCP.  Thank you Dr. Lucretia Roers for allowing me to participate in the care of your pleasant patient. Please do not hesitate to contact me with questions or concerns in the interim.  # DISPOSITION: # labs today- ordered # follow up in 2-3 weeks; MD: labs- none-Dr.B

## 2023-07-02 NOTE — Progress Notes (Signed)
[facility] CONSULT NOTE  Patient Care Team: Tower, Audrie Gallus, MD as PCP - General Donneta Romberg, Worthy Flank, MD as Consulting Physician (Oncology)  CHIEF COMPLAINTS/PURPOSE OF CONSULTATION: Hereditary hemochromatosis/Elevated Ferritn   HEMATOLOGY HISTORY  #  FERRITIN:  Iron sat:  HISTORY OF PRESENTING ILLNESS: Patient ambulating-independently.  Alone.  Claudia Carter 57 y.o.  female has been referred to [place] for further evaluation/work-up/ recommendations for elevated ferritin/hemochromatosis  As part work-up patient for on going fatigue patient was noted to have elevated ferritin.  Of note patient was recently diagnosed with B12 deficiency/bilateral lower extremity tingling and numbness.  Personal Hx of liver disease: none  Hepatitis B/C: None Alcohol: sometimes- week end- beer.   Family Hx of Liver disease or cancer: none  Review of Systems  Constitutional:  Positive for malaise/fatigue. Negative for chills, diaphoresis, fever and weight loss.  HENT:  Negative for nosebleeds and sore throat.   Eyes:  Negative for double vision.  Respiratory:  Negative for cough, hemoptysis, sputum production, shortness of breath and wheezing.   Cardiovascular:  Negative for chest pain, palpitations, orthopnea and leg swelling.  Gastrointestinal:  Negative for abdominal pain, blood in stool, constipation, diarrhea, heartburn, melena, nausea and vomiting.  Genitourinary:  Negative for dysuria, frequency and urgency.  Musculoskeletal:  Negative for back pain and joint pain.  Skin: Negative.  Negative for itching and rash.  Neurological:  Positive for tingling. Negative for dizziness, focal weakness, weakness and headaches.  Endo/Heme/Allergies:  Does not bruise/bleed easily.  Psychiatric/Behavioral:  Negative for depression. The patient is not nervous/anxious and does not have insomnia.     MEDICAL HISTORY:  Past Medical History:  Diagnosis Date   Allergy    Anxiety     Eczema    Fibrocystic breast    Hyperlipidemia    Pneumonia    Seizures (HCC)    per pt had 1 seizure at age 54 and 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 maw   Tobacco abuse    quit 2011    SURGICAL HISTORY: Past Surgical History:  Procedure Laterality Date   BREAST CYST EXCISION     pneumonia     TONSILLECTOMY     TUBAL LIGATION      SOCIAL HISTORY: Social History   Socioeconomic History   Marital status: Married    Spouse name: Not on file   Number of children: 2   Years of education: Not on file   Highest education level: Some college, no degree  Occupational History   Occupation: [employer]  Tobacco Use   Smoking status: Every Day    Types: E-cigarettes   Smokeless tobacco: Never   Tobacco comments:    Only vapes now  Substance and Sexual Activity   Alcohol use: Yes    Alcohol/week: 0.0 standard drinks of alcohol    Comment: occ   Drug use: No   Sexual activity: Not on file  Other Topics Concern   Not on file  Social History Narrative   Not on file   Social Determinants of Health   Financial Resource Strain: Low Risk  (05/13/2023)   Overall Financial Resource Strain (CARDIA)    Difficulty of Paying Living Expenses: Not hard at all  Food Insecurity: No Food Insecurity (07/02/2023)   Hunger Vital Sign    Worried About Running Out of Food in the Last Year: Never true    Ran Out of Food in the Last Year: Never true  Transportation Needs: No Transportation Needs (07/02/2023)   PRAPARE -  Administrator, Civil Service (Medical): No    Lack of Transportation (Non-Medical): No  Physical Activity: Insufficiently Active (05/13/2023)   Exercise Vital Sign    Days of Exercise per Week: 2 days    Minutes of Exercise per Session: 30 min  Stress: No Stress Concern Present (05/13/2023)   [employer] of Occupational Health - Occupational Stress Questionnaire    Feeling of Stress : Only a little  Social Connections: Moderately Isolated (05/13/2023)   Social Connection and  Isolation Panel [NHANES]    Frequency of Communication with Friends and Family: Three times a week    Frequency of Social Gatherings with Friends and Family: Once a week    Attends Religious Services: Never    Database administrator or Organizations: No    Attends Engineer, structural: Not on file    Marital Status: Married  Catering manager Violence: Not At Risk (07/02/2023)   Humiliation, Afraid, Rape, and Kick questionnaire    Fear of Current or Ex-Partner: No    Emotionally Abused: No    Physically Abused: No    Sexually Abused: No    FAMILY HISTORY: Family History  Problem Relation Age of Onset   Diabetes Mother    Hypertension Mother    Ovarian cysts Mother    Hypertension Father    Asthma Father    Cancer Cousin        melanoma   Migraines Sister    Cancer Maternal Grandmother        breast   Cancer Paternal Grandfather        lung   Colon cancer Neg Hx    Esophageal cancer Neg Hx    Stomach cancer Neg Hx    Rectal cancer Neg Hx     ALLERGIES:  is allergic to adhesive [tape], neomycin-bacitracin zn-polymyx, penicillins, and sulfamethoxazole-trimethoprim.  MEDICATIONS:  Current Outpatient Medications  Medication Sig Dispense Refill   cyanocobalamin (VITAMIN B12) 1000 MCG tablet Take 1,000 mcg by mouth daily.     folic acid (FOLVITE) 800 MCG tablet Take 400 mcg by mouth daily.     rosuvastatin (CRESTOR) 5 MG tablet TAKE 1 TABLET BY MOUTH EVERY DAY 90 tablet 2   venlafaxine XR (EFFEXOR-XR) 37.5 MG 24 hr capsule TAKE 1 CAPSULE BY MOUTH DAILY WITH BREAKFAST. 90 capsule 2   VITAMIN D PO Take 1,000 mcg by mouth daily.     pyridOXINE (B-6) 50 MG tablet Take 50 mg by mouth daily. (Patient not taking: Reported on 07/02/2023)     No current facility-administered medications for this visit.      PHYSICAL EXAMINATION:   Vitals:   07/02/23 1409  BP: 132/86  Pulse: 90  Temp: 98.6 F (37 C)  SpO2: 99%   Filed Weights   07/02/23 1409  Weight: 175 lb 9.6  oz (79.7 kg)    Physical Exam Vitals and nursing note reviewed.  HENT:     Head: Normocephalic and atraumatic.     Mouth/Throat:     Pharynx: Oropharynx is clear.  Eyes:     Extraocular Movements: Extraocular movements intact.     Pupils: Pupils are equal, round, and reactive to light.  Cardiovascular:     Rate and Rhythm: Normal rate and regular rhythm.  Pulmonary:     Comments: Decreased breath sounds bilaterally.  Abdominal:     Palpations: Abdomen is soft.  Musculoskeletal:        General: Normal range of motion.  Cervical back: Normal range of motion.  Skin:    General: Skin is warm.  Neurological:     General: No focal deficit present.     Mental Status: She is alert and oriented to person, place, and time.  Psychiatric:        Behavior: Behavior normal.        Judgment: Judgment normal.     LABORATORY DATA:  I have reviewed the data as listed Lab Results  Component Value Date   WBC 4.7 06/25/2023   HGB 13.0 06/25/2023   HCT 38.9 06/25/2023   MCV 93.1 06/25/2023   PLT 278.0 06/25/2023   Recent Labs    04/04/23 1558 05/21/23 0839  NA 139  --   K 4.0  --   CL 103  --   CO2 24  --   GLUCOSE 94  --   BUN 17  --   CREATININE 0.79  --   CALCIUM 9.5  --   PROT 6.9 7.2  ALBUMIN  --  4.6  AST 21 23  ALT 22 24  ALKPHOS  --  65  BILITOT 0.4 0.6  BILIDIR  --  0.1     No results found.  Elevated ferritin # ELEVATED FERRITIN: Patient slightly elevated ferritin [JULY-AUG 2024-FERRITIN: 300-500; Iron saturations: NA].  # I discussed the causes of elevated ferritin could be multiple-including not limited to hereditary hemochromatosis. inflammation/liver disease/metabolic syndrome etc.    # I long discussion regarding the potential diagnosis of hereditary hemochromatosis-the potential complications including but not limited to worsening fatigue; arthritis; cardiomyopathy hypogonadism, diabetes skin changes etc.  Again reminded that it is extremely rare to  have a clinical syndrome of hemochromatosis as long as patient having regular blood checks; and phlebotomy as needed for elevated ferritin.    # Check labs-iron studies ferritin CRP hemochromatosis genotyping   # Fatigue unclear etiology-question related to above.  Await further workup.  # Bilateral lower extremity neuropathy low B12- ?  Etiology-B12 tablets/injections as per PCP.  Thank you Dr. Lucretia Roers for allowing me to participate in the care of your pleasant patient. Please do not hesitate to contact me with questions or concerns in the interim.  # DISPOSITION: # labs today- ordered # follow up in 2-3 weeks; MD: labs- none-Dr.B  All questions were answered. The patient knows to call the clinic with any problems, questions or concerns.   Earna Coder, MD 07/02/2023 3:20 PM

## 2023-07-02 NOTE — Progress Notes (Signed)
C/o "mom thumb" lt hand.  C/o of feet hurting and burning, neuropathy.  Does have trouble sleeping, takes benadryl to help.

## 2023-07-10 LAB — HEMOCHROMATOSIS DNA-PCR(C282Y,H63D)

## 2023-07-16 ENCOUNTER — Encounter: Payer: Self-pay | Admitting: Internal Medicine

## 2023-07-16 ENCOUNTER — Inpatient Hospital Stay (HOSPITAL_BASED_OUTPATIENT_CLINIC_OR_DEPARTMENT_OTHER): Payer: BC Managed Care – PPO | Admitting: Internal Medicine

## 2023-07-16 DIAGNOSIS — Z803 Family history of malignant neoplasm of breast: Secondary | ICD-10-CM | POA: Diagnosis not present

## 2023-07-16 DIAGNOSIS — R7989 Other specified abnormal findings of blood chemistry: Secondary | ICD-10-CM | POA: Diagnosis not present

## 2023-07-16 DIAGNOSIS — E538 Deficiency of other specified B group vitamins: Secondary | ICD-10-CM | POA: Diagnosis not present

## 2023-07-16 DIAGNOSIS — Z808 Family history of malignant neoplasm of other organs or systems: Secondary | ICD-10-CM | POA: Diagnosis not present

## 2023-07-16 DIAGNOSIS — R200 Anesthesia of skin: Secondary | ICD-10-CM | POA: Diagnosis not present

## 2023-07-16 DIAGNOSIS — G629 Polyneuropathy, unspecified: Secondary | ICD-10-CM | POA: Diagnosis not present

## 2023-07-16 DIAGNOSIS — Z801 Family history of malignant neoplasm of trachea, bronchus and lung: Secondary | ICD-10-CM | POA: Diagnosis not present

## 2023-07-16 DIAGNOSIS — R5383 Other fatigue: Secondary | ICD-10-CM | POA: Diagnosis not present

## 2023-07-16 DIAGNOSIS — F17290 Nicotine dependence, other tobacco product, uncomplicated: Secondary | ICD-10-CM | POA: Diagnosis not present

## 2023-07-16 NOTE — Progress Notes (Signed)
[facility] CONSULT NOTE  Patient Care Team: Tower, Audrie Gallus, MD as PCP - General Donneta Romberg, Worthy Flank, MD as Consulting Physician (Oncology)  CHIEF COMPLAINTS/PURPOSE OF CONSULTATION: Hereditary hemochromatosis/Elevated Ferritn   HEMATOLOGY HISTORY  #  FERRITIN:  Iron sat:  HISTORY OF PRESENTING ILLNESS: Patient ambulating-independently.  Alone.  Claudia Carter 57 y.o.  female is here to review the results of the blood work ordered for elevated ferritin/hemochromatosis.   Patient continues to have chronic mild fatigue.  Not any worse.   Also of note patient was recently diagnosed with B12 deficiency/bilateral lower extremity tingling and numbness.  Review of Systems  Constitutional:  Positive for malaise/fatigue. Negative for chills, diaphoresis, fever and weight loss.  HENT:  Negative for nosebleeds and sore throat.   Eyes:  Negative for double vision.  Respiratory:  Negative for cough, hemoptysis, sputum production, shortness of breath and wheezing.   Cardiovascular:  Negative for chest pain, palpitations, orthopnea and leg swelling.  Gastrointestinal:  Negative for abdominal pain, blood in stool, constipation, diarrhea, heartburn, melena, nausea and vomiting.  Genitourinary:  Negative for dysuria, frequency and urgency.  Musculoskeletal:  Negative for back pain and joint pain.  Skin: Negative.  Negative for itching and rash.  Neurological:  Positive for tingling. Negative for dizziness, focal weakness, weakness and headaches.  Endo/Heme/Allergies:  Does not bruise/bleed easily.  Psychiatric/Behavioral:  Negative for depression. The patient is not nervous/anxious and does not have insomnia.     MEDICAL HISTORY:  Past Medical History:  Diagnosis Date   Allergy    Anxiety    Eczema    Fibrocystic breast    Hyperlipidemia    Pneumonia    Seizures (HCC)    per pt had 1 seizure at age 19 and 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 maw   Tobacco abuse    quit 2011     SURGICAL HISTORY: Past Surgical History:  Procedure Laterality Date   BREAST CYST EXCISION     pneumonia     TONSILLECTOMY     TUBAL LIGATION      SOCIAL HISTORY: Social History   Socioeconomic History   Marital status: Married    Spouse name: Not on file   Number of children: 2   Years of education: Not on file   Highest education level: Some college, no degree  Occupational History   Occupation: [employer]  Tobacco Use   Smoking status: Every Day    Types: E-cigarettes   Smokeless tobacco: Never   Tobacco comments:    Only vapes now  Substance and Sexual Activity   Alcohol use: Yes    Alcohol/week: 0.0 standard drinks of alcohol    Comment: occ   Drug use: No   Sexual activity: Not on file  Other Topics Concern   Not on file  Social History Narrative   Not on file   Social Determinants of Health   Financial Resource Strain: Low Risk  (05/13/2023)   Overall Financial Resource Strain (CARDIA)    Difficulty of Paying Living Expenses: Not hard at all  Food Insecurity: No Food Insecurity (07/02/2023)   Hunger Vital Sign    Worried About Running Out of Food in the Last Year: Never true    Ran Out of Food in the Last Year: Never true  Transportation Needs: No Transportation Needs (07/02/2023)   PRAPARE - Administrator, Civil Service (Medical): No    Lack of Transportation (Non-Medical): No  Physical Activity: Insufficiently Active (05/13/2023)  Exercise Vital Sign    Days of Exercise per Week: 2 days    Minutes of Exercise per Session: 30 min  Stress: No Stress Concern Present (05/13/2023)   [employer] of Occupational Health - Occupational Stress Questionnaire    Feeling of Stress : Only a little  Social Connections: Moderately Isolated (05/13/2023)   Social Connection and Isolation Panel [NHANES]    Frequency of Communication with Friends and Family: Three times a week    Frequency of Social Gatherings with Friends and Family: Once a week     Attends Religious Services: Never    Database administrator or Organizations: No    Attends Engineer, structural: Not on file    Marital Status: Married  Catering manager Violence: Not At Risk (07/02/2023)   Humiliation, Afraid, Rape, and Kick questionnaire    Fear of Current or Ex-Partner: No    Emotionally Abused: No    Physically Abused: No    Sexually Abused: No    FAMILY HISTORY: Family History  Problem Relation Age of Onset   Diabetes Mother    Hypertension Mother    Ovarian cysts Mother    Hypertension Father    Asthma Father    Cancer Cousin        melanoma   Migraines Sister    Cancer Maternal Grandmother        breast   Cancer Paternal Grandfather        lung   Colon cancer Neg Hx    Esophageal cancer Neg Hx    Stomach cancer Neg Hx    Rectal cancer Neg Hx     ALLERGIES:  is allergic to adhesive [tape], neomycin-bacitracin zn-polymyx, penicillins, and sulfamethoxazole-trimethoprim.  MEDICATIONS:  Current Outpatient Medications  Medication Sig Dispense Refill   cyanocobalamin (VITAMIN B12) 1000 MCG tablet Take 1,000 mcg by mouth daily.     folic acid (FOLVITE) 800 MCG tablet Take 400 mcg by mouth daily.     rosuvastatin (CRESTOR) 5 MG tablet TAKE 1 TABLET BY MOUTH EVERY DAY 90 tablet 2   venlafaxine XR (EFFEXOR-XR) 37.5 MG 24 hr capsule TAKE 1 CAPSULE BY MOUTH DAILY WITH BREAKFAST. 90 capsule 2   VITAMIN D PO Take 1,000 mcg by mouth daily.     pyridOXINE (B-6) 50 MG tablet Take 50 mg by mouth daily. (Patient not taking: Reported on 07/02/2023)     No current facility-administered medications for this visit.      PHYSICAL EXAMINATION:   Vitals:   07/16/23 1448  BP: (!) 130/91  Pulse: 91  Temp: 98.5 F (36.9 C)  SpO2: 98%   Filed Weights   07/16/23 1448  Weight: 174 lb 6.4 oz (79.1 kg)    Physical Exam Vitals and nursing note reviewed.  HENT:     Head: Normocephalic and atraumatic.     Mouth/Throat:     Pharynx: Oropharynx is clear.   Eyes:     Extraocular Movements: Extraocular movements intact.     Pupils: Pupils are equal, round, and reactive to light.  Cardiovascular:     Rate and Rhythm: Normal rate and regular rhythm.  Pulmonary:     Comments: Decreased breath sounds bilaterally.  Abdominal:     Palpations: Abdomen is soft.  Musculoskeletal:        General: Normal range of motion.     Cervical back: Normal range of motion.  Skin:    General: Skin is warm.  Neurological:     General: No  focal deficit present.     Mental Status: She is alert and oriented to person, place, and time.  Psychiatric:        Behavior: Behavior normal.        Judgment: Judgment normal.     LABORATORY DATA:  I have reviewed the data as listed Lab Results  Component Value Date   WBC 4.7 06/25/2023   HGB 13.0 06/25/2023   HCT 38.9 06/25/2023   MCV 93.1 06/25/2023   PLT 278.0 06/25/2023   Recent Labs    04/04/23 1558 05/21/23 0839  NA 139  --   K 4.0  --   CL 103  --   CO2 24  --   GLUCOSE 94  --   BUN 17  --   CREATININE 0.79  --   CALCIUM 9.5  --   PROT 6.9 7.2  ALBUMIN  --  4.6  AST 21 23  ALT 22 24  ALKPHOS  --  65  BILITOT 0.4 0.6  BILIDIR  --  0.1     No results found.  Hemochromatosis, hereditary (HCC) # Hemochromatosis [JULY-AUG 2024-FERRITIN: 300-500; Iron saturations: NA]. c.187C>G (p.His63Asp) - Detected, homozygous.  August 2024 saturation 24%.  Patient asymptomatic.  # I long discussion regarding the diagnosis of hereditary hemochromatosis-the potential complications including but not limited to worsening fatigue; arthritis; cardiomyopathy hypogonadism, diabetes skin changes etc.  Again reminded that it is extremely rare to have a clinical syndrome of hemochromatosis as long as patient having regular blood checks; and phlebotomy as needed for elevated ferritin.   # Recommend phlebotomy to keep the ferritin less than 150.  Proceed with phlebotomy with [facility].  # Fatigue unclear etiology-I  doubt if this is related to elevated ferritin.  # Bilateral lower extremity neuropathy low B12- ?  Etiology-B12 tablets/injections as per PCP.  # Genetic counseling- daughter x2 [in 30 ys]-recommend reaching out to them to discuss the results of her hemochromatosis genotyping.   *[facility] # DISPOSITION: # follow up in 6 months- MD: 2-3 days labs-cbc/cmp; iron studies- -Dr.B  Cc; Dr.Towers.    All questions were answered. The patient knows to call the clinic with any problems, questions or concerns.   Earna Coder, MD 07/16/2023 3:33 PM

## 2023-07-16 NOTE — Progress Notes (Signed)
No question/concerns today.

## 2023-07-16 NOTE — Assessment & Plan Note (Addendum)
#   Hemochromatosis [JULY-AUG 2024-FERRITIN: 300-500; Iron saturations: NA]. c.187C>G (p.His63Asp) - Detected, homozygous.  August 2024 saturation 24%.  Patient asymptomatic.  # I long discussion regarding the diagnosis of hereditary hemochromatosis-the potential complications including but not limited to worsening fatigue; arthritis; cardiomyopathy hypogonadism, diabetes skin changes etc.  Again reminded that it is extremely rare to have a clinical syndrome of hemochromatosis as long as patient having regular blood checks; and phlebotomy as needed for elevated ferritin.   # Recommend phlebotomy to keep the ferritin less than 150.  Proceed with phlebotomy with [facility].  # Fatigue unclear etiology-I doubt if this is related to elevated ferritin.  # Bilateral lower extremity neuropathy low B12- ?  Etiology-B12 tablets/injections as per PCP.  # Genetic counseling- daughter x2 [in 30 ys]-recommend reaching out to them to discuss the results of her hemochromatosis genotyping.   *[facility] # DISPOSITION: # follow up in 6 months- MD: 2-3 days labs-cbc/cmp; iron studies- -Dr.B  Cc; Dr.Towers.

## 2024-01-14 ENCOUNTER — Inpatient Hospital Stay: Payer: BC Managed Care – PPO

## 2024-01-16 ENCOUNTER — Ambulatory Visit: Payer: BC Managed Care – PPO | Admitting: Internal Medicine

## 2024-03-27 ENCOUNTER — Other Ambulatory Visit: Payer: Self-pay | Admitting: Family Medicine

## 2024-03-29 NOTE — Telephone Encounter (Signed)
 Last CPE was 04/04/23, please schedule CPE (labs prior) on or after 04/04/24 and route back to me, thanks

## 2024-03-29 NOTE — Telephone Encounter (Signed)
 lvm for pt to call office to schedule appt.

## 2024-03-30 NOTE — Telephone Encounter (Signed)
 lvm for pt to call office to schedule appt.

## 2024-03-31 NOTE — Telephone Encounter (Signed)
 Pt called and schedule appt

## 2024-04-06 ENCOUNTER — Encounter: Payer: Self-pay | Admitting: Family Medicine

## 2024-04-06 ENCOUNTER — Ambulatory Visit (INDEPENDENT_AMBULATORY_CARE_PROVIDER_SITE_OTHER): Admitting: Family Medicine

## 2024-04-06 VITALS — BP 130/82 | HR 89 | Temp 98.4°F | Ht 65.25 in | Wt 177.4 lb

## 2024-04-06 DIAGNOSIS — E538 Deficiency of other specified B group vitamins: Secondary | ICD-10-CM | POA: Diagnosis not present

## 2024-04-06 DIAGNOSIS — E7800 Pure hypercholesterolemia, unspecified: Secondary | ICD-10-CM

## 2024-04-06 DIAGNOSIS — E559 Vitamin D deficiency, unspecified: Secondary | ICD-10-CM

## 2024-04-06 DIAGNOSIS — Z0000 Encounter for general adult medical examination without abnormal findings: Secondary | ICD-10-CM | POA: Diagnosis not present

## 2024-04-06 DIAGNOSIS — F419 Anxiety disorder, unspecified: Secondary | ICD-10-CM

## 2024-04-06 DIAGNOSIS — Z7289 Other problems related to lifestyle: Secondary | ICD-10-CM

## 2024-04-06 DIAGNOSIS — F32A Depression, unspecified: Secondary | ICD-10-CM

## 2024-04-06 LAB — CBC WITH DIFFERENTIAL/PLATELET
Basophils Absolute: 0 10*3/uL (ref 0.0–0.1)
Basophils Relative: 0.6 % (ref 0.0–3.0)
Eosinophils Absolute: 0.2 10*3/uL (ref 0.0–0.7)
Eosinophils Relative: 3.1 % (ref 0.0–5.0)
HCT: 39.9 % (ref 36.0–46.0)
Hemoglobin: 13.7 g/dL (ref 12.0–15.0)
Lymphocytes Relative: 30.1 % (ref 12.0–46.0)
Lymphs Abs: 1.5 10*3/uL (ref 0.7–4.0)
MCHC: 34.2 g/dL (ref 30.0–36.0)
MCV: 91.9 fl (ref 78.0–100.0)
Monocytes Absolute: 0.4 10*3/uL (ref 0.1–1.0)
Monocytes Relative: 7.7 % (ref 3.0–12.0)
Neutro Abs: 3 10*3/uL (ref 1.4–7.7)
Neutrophils Relative %: 58.5 % (ref 43.0–77.0)
Platelets: 196 10*3/uL (ref 150.0–400.0)
RBC: 4.35 Mil/uL (ref 3.87–5.11)
RDW: 12.2 % (ref 11.5–15.5)
WBC: 5.1 10*3/uL (ref 4.0–10.5)

## 2024-04-06 LAB — LIPID PANEL
Cholesterol: 219 mg/dL — ABNORMAL HIGH (ref 0–200)
HDL: 60.5 mg/dL (ref >39.00)
NonHDL: 158.52
Total CHOL/HDL Ratio: 4
Triglycerides: 478 mg/dL — ABNORMAL HIGH (ref 0.0–149.0)
VLDL: 95.6 mg/dL — ABNORMAL HIGH (ref 0.0–40.0)

## 2024-04-06 LAB — COMPREHENSIVE METABOLIC PANEL WITH GFR
ALT: 14 U/L (ref 0–35)
AST: 16 U/L (ref 0–37)
Albumin: 4.7 g/dL (ref 3.5–5.2)
Alkaline Phosphatase: 70 U/L (ref 39–117)
BUN: 19 mg/dL (ref 6–23)
CO2: 26 meq/L (ref 19–32)
Calcium: 9.6 mg/dL (ref 8.4–10.5)
Chloride: 103 meq/L (ref 96–112)
Creatinine, Ser: 0.78 mg/dL (ref 0.40–1.20)
GFR: 83.44 mL/min (ref >60.00)
Glucose, Bld: 98 mg/dL (ref 70–99)
Potassium: 3.8 meq/L (ref 3.5–5.1)
Sodium: 139 meq/L (ref 135–145)
Total Bilirubin: 0.4 mg/dL (ref 0.2–1.2)
Total Protein: 6.9 g/dL (ref 6.0–8.3)

## 2024-04-06 LAB — TSH: TSH: 1.5 u[IU]/mL (ref 0.35–5.50)

## 2024-04-06 LAB — LDL CHOLESTEROL, DIRECT: Direct LDL: 108 mg/dL

## 2024-04-06 NOTE — Assessment & Plan Note (Signed)
 D level today  Discussed the importance to bone and overall health

## 2024-04-06 NOTE — Patient Instructions (Addendum)
 Go ahead and schedule your mammogram   Get back to some exercise when you can Stay active  Add some strength training to your routine, this is important for bone and brain health and can reduce your risk of falls and help your body use insulin properly and regulate weight  Light weights, exercise bands , and internet videos are a good way to start  Yoga (chair or regular), machines , floor exercises or a gym with machines are also good options   Labs today   Get time to take care of yourself when you can

## 2024-04-06 NOTE — Assessment & Plan Note (Signed)
 Continues hematology care  Phlebotomy prn

## 2024-04-06 NOTE — Progress Notes (Signed)
 Subjective:    Patient ID: Claudia Carter, female    DOB: Jun 08, 1966, 58 y.o.   MRN: [id number]  HPI  Here for health maintenance exam and to review chronic medical problems   Wt Readings from Last 3 Encounters:  04/06/24 177 lb 6 oz (80.5 kg)  07/16/23 174 lb 6.4 oz (79.1 kg)  07/02/23 175 lb 9.6 oz (79.7 kg)   29.29 kg/m  Vitals:   04/06/24 1356  BP: 130/82  Pulse: 89  Temp: 98.4 F (36.9 C)  SpO2: 97%    Immunization History  Administered Date(s) Administered   H1N1 12/08/2008   Influenza Split 12/06/2011   Influenza Whole 10/04/2002   Influenza,inj,Quad PF,6+ Mos 10/11/2013, 09/06/2015, 12/20/2016, 01/23/2018   PFIZER(Purple Top)SARS-COV-2 Vaccination 01/23/2020, 02/15/2020   Pneumococcal Polysaccharide-23 11/04/2007   Td 10/04/2002   Tdap 12/06/2011, 02/04/2022    Health Maintenance Due  Topic Date Due   HIV Screening  Never done   Pneumococcal Vaccine 12-20 Years old (2 of 2 - PCV) 11/03/2008   MAMMOGRAM  03/18/2024     [facility]- [facility]  Self breast exam- no lumps   Gyn health Pap 01/2022 normal  No problems  Has a hot flash occasionally -not often  Low libido    Colon cancer screening  colonoscopy 12/2015 with 10 y recall   Bone health   Falls-none  Fractures-none  Supplements -vitamin d   Last vitamin D  Lab Results  Component Value Date   VD25OH 22.28 (L) 05/14/2023    Exercise:  Not enough  Schedule prohibits / is helping with grandchild       Mood    04/06/2024    2:02 PM 07/02/2023    2:21 PM 05/14/2023    9:52 AM 02/04/2022    2:02 PM 05/16/2020    3:12 PM  Depression screen PHQ 2/9  Decreased Interest 0 0 1 0 0  Down, Depressed, Hopeless 0 0 1 0 0  PHQ - 2 Score 0 0 2 0 0  Altered sleeping 1  3 0   Tired, decreased energy 1  3 0   Change in appetite 1  1 0   Feeling bad or failure about yourself  0  1 0   Trouble concentrating 0  1 0   Moving slowly or fidgety/restless 0  0 0   Suicidal  thoughts 0  0 0   PHQ-9 Score 3  11 0   Difficult doing work/chores Not difficult at all  Somewhat difficult Not difficult at all     Effexor  xr 37.5 mg daily  Still helping    Sees heme for hemochromatosis  Lab Results  Component Value Date   WBC 4.7 06/25/2023   HGB 13.0 06/25/2023   HCT 38.9 06/25/2023   MCV 93.1 06/25/2023   PLT 278.0 06/25/2023   Hyperlipidemia Lab Results  Component Value Date   CHOL 181 04/04/2023   HDL 69 04/04/2023   LDLCALC 80 04/04/2023   LDLDIRECT 147.0 04/28/2020   TRIG 227 (H) 04/04/2023   CHOLHDL 2.6 04/04/2023   Crestor  5 mg daily     Patient Active Problem List   Diagnosis Date Noted   Current every day vaping 02/04/2022    Priority: High   Hemochromatosis, hereditary (HCC) 07/16/2023   Vitamin D  deficiency 05/22/2023   Elevated ferritin 05/21/2023   Vitamin B12 deficiency 05/21/2023   Fatigue 05/14/2023   Hair loss 05/14/2023   Bilateral arm pain 05/14/2023   Anxiety and  depression 03/07/2022   Somnolence 01/18/2019   Snoring 01/18/2019   Screening mammogram, encounter for 12/20/2016   Colon cancer screening 09/06/2015   Encounter for routine gynecological examination 10/11/2013   Routine general medical examination at a health care facility 11/26/2011   Hyperlipidemia 12/08/2008   FIBROCYSTIC BREAST DISEASE 10/27/2007   ECZEMA 10/27/2007   Past Medical History:  Diagnosis Date   Allergy    Anxiety    Eczema    Fibrocystic breast    Hyperlipidemia    Pneumonia    Seizures (HCC)    per pt had 1 seizure at age 34 and 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 maw   Tobacco abuse    quit 2011   Past Surgical History:  Procedure Laterality Date   BREAST CYST EXCISION     pneumonia     TONSILLECTOMY     TUBAL LIGATION     Social History   Tobacco Use   Smoking status: Every Day    Types: E-cigarettes   Smokeless tobacco: Never   Tobacco comments:    Only vapes now  Substance Use Topics   Alcohol use: Yes    Comment: occ    Drug use: No   Family History  Problem Relation Age of Onset   Diabetes Mother    Hypertension Mother    Ovarian cysts Mother    Hypertension Father    Asthma Father    Cancer Cousin        melanoma   Migraines Sister    Cancer Maternal Grandmother        breast   Cancer Paternal Grandfather        lung   Colon cancer Neg Hx    Esophageal cancer Neg Hx    Stomach cancer Neg Hx    Rectal cancer Neg Hx    Allergies  Allergen Reactions   Adhesive [Tape]     Per the pt , "when I were tape for a long time, days, it breaks out my skin". maw   Neomycin-Bacitracin Zn-Polymyx     REACTION: rash   Penicillins     REACTION: rash   Sulfamethoxazole-Trimethoprim Rash   Current Outpatient Medications on File Prior to Visit  Medication Sig Dispense Refill   cyanocobalamin  (VITAMIN B12) 1000 MCG tablet Take 1,000 mcg by mouth daily.     folic acid (FOLVITE) 800 MCG tablet Take 400 mcg by mouth daily.     pyridOXINE (B-6) 50 MG tablet Take 50 mg by mouth daily.     rosuvastatin  (CRESTOR ) 5 MG tablet TAKE 1 TABLET BY MOUTH EVERY DAY 90 tablet 0   venlafaxine  XR (EFFEXOR -XR) 37.5 MG 24 hr capsule TAKE 1 CAPSULE BY MOUTH DAILY WITH BREAKFAST. 90 capsule 0   VITAMIN D  PO Take 1,000 mcg by mouth daily.     No current facility-administered medications on file prior to visit.    Review of Systems  Constitutional:  Positive for fatigue. Negative for activity change, appetite change, fever and unexpected weight change.       Very busy schedule   HENT:  Negative for congestion, ear pain, rhinorrhea, sinus pressure and sore throat.   Eyes:  Negative for pain, redness and visual disturbance.  Respiratory:  Negative for cough, shortness of breath and wheezing.   Cardiovascular:  Negative for chest pain and palpitations.  Gastrointestinal:  Negative for abdominal pain, blood in stool, constipation and diarrhea.  Endocrine: Negative for polydipsia and polyuria.  Genitourinary:  Negative for  dysuria, frequency  and urgency.  Musculoskeletal:  Negative for arthralgias, back pain and myalgias.  Skin:  Negative for pallor and rash.  Allergic/Immunologic: Negative for environmental allergies.  Neurological:  Negative for dizziness, syncope and headaches.  Hematological:  Negative for adenopathy. Does not bruise/bleed easily.  Psychiatric/Behavioral:  Negative for decreased concentration and dysphoric mood. The patient is not nervous/anxious.        Objective:   Physical Exam Constitutional:      General: She is not in acute distress.    Appearance: Normal appearance. She is well-developed. She is not ill-appearing or diaphoretic.     Comments: Overweight   HENT:     Head: Normocephalic and atraumatic.     Right Ear: Tympanic membrane, ear canal and external ear normal.     Left Ear: Tympanic membrane, ear canal and external ear normal.     Nose: Nose normal. No congestion.     Mouth/Throat:     Mouth: Mucous membranes are moist.     Pharynx: Oropharynx is clear. No posterior oropharyngeal erythema.  Eyes:     General: No scleral icterus.    Extraocular Movements: Extraocular movements intact.     Conjunctiva/sclera: Conjunctivae normal.     Pupils: Pupils are equal, round, and reactive to light.  Neck:     Thyroid: No thyromegaly.     Vascular: No carotid bruit or JVD.  Cardiovascular:     Rate and Rhythm: Normal rate and regular rhythm.     Pulses: Normal pulses.     Heart sounds: Normal heart sounds.     No gallop.  Pulmonary:     Effort: Pulmonary effort is normal. No respiratory distress.     Breath sounds: Normal breath sounds. No wheezing.     Comments: Bs are slightly distant  Chest:     Chest wall: No tenderness.  Abdominal:     General: Bowel sounds are normal. There is no distension or abdominal bruit.     Palpations: Abdomen is soft. There is no mass.     Tenderness: There is no abdominal tenderness.     Hernia: No hernia is present.   Genitourinary:    Comments: Breast exam: No mass, nodules, thickening, tenderness, bulging, retraction, inflamation, nipple discharge or skin changes noted.  No axillary or clavicular LA.     Musculoskeletal:        General: No tenderness. Normal range of motion.     Cervical back: Normal range of motion and neck supple. No rigidity. No muscular tenderness.     Right lower leg: No edema.     Left lower leg: No edema.     Comments: No kyphosis   Lymphadenopathy:     Cervical: No cervical adenopathy.  Skin:    General: Skin is warm and dry.     Coloration: Skin is not pale.     Findings: No erythema or rash.     Comments: Solar lentigines diffusely   Neurological:     Mental Status: She is alert. Mental status is at baseline.     Cranial Nerves: No cranial nerve deficit.     Motor: No abnormal muscle tone.     Coordination: Coordination normal.     Gait: Gait normal.     Deep Tendon Reflexes: Reflexes are normal and symmetric. Reflexes normal.  Psychiatric:        Mood and Affect: Mood normal.        Cognition and Memory: Cognition and memory normal.  Assessment & Plan:   Problem List Items Addressed This Visit       Other   Current every day vaping   No longer smoking cigarettes Encouraged to quit vaping in future as well for general health      Vitamin D  deficiency   D level today  Discussed the importance to bone and overall health       Relevant Orders   VITAMIN D  25 Hydroxy (Vit-D Deficiency, Fractures)   Vitamin B12 deficiency   B12 level today  Taking 1000 mcg daily       Relevant Orders   Vitamin B12   Routine general medical examination at a health care facility - Primary   Reviewed health habits including diet and exercise and skin cancer prevention Reviewed appropriate screening tests for age  Also reviewed health mt list, fam hx and immunization status , as well as social and family history   See HPI Labs reviewed and  ordered Health Maintenance  Topic Date Due   HIV Screening  Never done   Pneumococcal Vaccination (2 of 2 - PCV) 11/03/2008   Mammogram  03/18/2024   Hepatitis C Screening  05/20/2024*   Zoster (Shingles) Vaccine (1 of 2) 07/07/2025*   COVID-19 Vaccine (3 - 2024-25 season) 04/22/2026*   Flu Shot  06/18/2024   Colon Cancer Screening  12/20/2025   Pap with HPV screening  02/05/2027   DTaP/Tdap/Td vaccine (4 - Td or Tdap) 02/05/2032   HPV Vaccine  Aged Out   Meningitis B Vaccine  Aged Out  *Topic was postponed. The date shown is not the original due date.    Plans to schedule mammo soon  Discussed fall prevention, supplements and exercise for bone density  Lab today  PHQ 3 with treatment       Relevant Orders   CBC with Differential/Platelet   Comprehensive metabolic panel with GFR   Lipid Panel   TSH   Hyperlipidemia   Disc goals for lipids and reasons to control them Rev last labs with pt Rev low sat fat diet in detail   Lab today  Continues crestor  5 mg daily       Relevant Orders   Comprehensive metabolic panel with GFR   Lipid Panel   Hemochromatosis, hereditary (HCC)   Continues hematology care  Phlebotomy prn      Relevant Orders   CBC with Differential/Platelet   Anxiety and depression   PHQ 3 Continues effexor  xr 37.5 daily  Tolerates it well  Encouraged good self care incl exercise when she can find the time

## 2024-04-06 NOTE — Assessment & Plan Note (Signed)
 PHQ 3 Continues effexor  xr 37.5 daily  Tolerates it well  Encouraged good self care incl exercise when she can find the time

## 2024-04-06 NOTE — Assessment & Plan Note (Signed)
Disc goals for lipids and reasons to control them Rev last labs with pt Rev low sat fat diet in detail  Lab today  Continues crestor 5 mg daily

## 2024-04-06 NOTE — Assessment & Plan Note (Signed)
 Reviewed health habits including diet and exercise and skin cancer prevention Reviewed appropriate screening tests for age  Also reviewed health mt list, fam hx and immunization status , as well as social and family history   See HPI Labs reviewed and ordered Health Maintenance  Topic Date Due   HIV Screening  Never done   Pneumococcal Vaccination (2 of 2 - PCV) 11/03/2008   Mammogram  03/18/2024   Hepatitis C Screening  05/20/2024*   Zoster (Shingles) Vaccine (1 of 2) 07/07/2025*   COVID-19 Vaccine (3 - 2024-25 season) 04/22/2026*   Flu Shot  06/18/2024   Colon Cancer Screening  12/20/2025   Pap with HPV screening  02/05/2027   DTaP/Tdap/Td vaccine (4 - Td or Tdap) 02/05/2032   HPV Vaccine  Aged Out   Meningitis B Vaccine  Aged Out  *Topic was postponed. The date shown is not the original due date.    Plans to schedule mammo soon  Discussed fall prevention, supplements and exercise for bone density  Lab today  PHQ 3 with treatment

## 2024-04-06 NOTE — Assessment & Plan Note (Signed)
 B12 level today  Taking 1000 mcg daily

## 2024-04-06 NOTE — Assessment & Plan Note (Signed)
 No longer smoking cigarettes Encouraged to quit vaping in future as well for general health

## 2024-04-07 ENCOUNTER — Ambulatory Visit: Payer: Self-pay | Admitting: Family Medicine

## 2024-04-07 DIAGNOSIS — E7800 Pure hypercholesterolemia, unspecified: Secondary | ICD-10-CM

## 2024-04-07 LAB — VITAMIN D 25 HYDROXY (VIT D DEFICIENCY, FRACTURES): VITD: 28.71 ng/mL — ABNORMAL LOW (ref 30.00–100.00)

## 2024-04-07 LAB — VITAMIN B12: Vitamin B-12: 885 pg/mL (ref 211–911)

## 2024-06-26 ENCOUNTER — Other Ambulatory Visit: Payer: Self-pay | Admitting: Family Medicine

## 2024-07-05 ENCOUNTER — Encounter: Payer: Self-pay | Admitting: Family Medicine

## 2024-07-20 DIAGNOSIS — H43812 Vitreous degeneration, left eye: Secondary | ICD-10-CM | POA: Diagnosis not present
